# Patient Record
Sex: Female | Born: 1990 | Race: Black or African American | Hispanic: No | Marital: Single | Smoking: Current every day smoker
Health system: Southern US, Community
[De-identification: ages and names within clinical notes are randomized; demographics above are authoritative.]

## PROBLEM LIST (undated history)

## (undated) ENCOUNTER — Inpatient Hospital Stay (HOSPITAL_COMMUNITY): Payer: Self-pay

## (undated) DIAGNOSIS — Z6841 Body Mass Index (BMI) 40.0 and over, adult: Secondary | ICD-10-CM

## (undated) DIAGNOSIS — J45909 Unspecified asthma, uncomplicated: Secondary | ICD-10-CM

## (undated) DIAGNOSIS — I10 Essential (primary) hypertension: Secondary | ICD-10-CM

## (undated) DIAGNOSIS — K8020 Calculus of gallbladder without cholecystitis without obstruction: Secondary | ICD-10-CM

## (undated) DIAGNOSIS — I509 Heart failure, unspecified: Secondary | ICD-10-CM

## (undated) HISTORY — PX: NO PAST SURGERIES: SHX2092

---
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
## 2012-09-06 ENCOUNTER — Encounter (HOSPITAL_COMMUNITY): Payer: Self-pay | Admitting: *Deleted

## 2012-09-06 ENCOUNTER — Inpatient Hospital Stay (HOSPITAL_COMMUNITY): Payer: Medicaid Other

## 2012-09-06 ENCOUNTER — Inpatient Hospital Stay (HOSPITAL_COMMUNITY)
Admission: AD | Admit: 2012-09-06 | Discharge: 2012-09-06 | Disposition: A | Discharge status: Home / Self Care | Payer: Medicaid Other | Source: Ambulatory Visit | Attending: Obstetrics and Gynecology | Admitting: Obstetrics and Gynecology

## 2012-09-06 DIAGNOSIS — R109 Unspecified abdominal pain: Secondary | ICD-10-CM | POA: Insufficient documentation

## 2012-09-06 DIAGNOSIS — O9989 Other specified diseases and conditions complicating pregnancy, childbirth and the puerperium: Secondary | ICD-10-CM

## 2012-09-06 DIAGNOSIS — N949 Unspecified condition associated with female genital organs and menstrual cycle: Secondary | ICD-10-CM | POA: Insufficient documentation

## 2012-09-06 DIAGNOSIS — R102 Pelvic and perineal pain: Secondary | ICD-10-CM

## 2012-09-06 DIAGNOSIS — O99891 Other specified diseases and conditions complicating pregnancy: Secondary | ICD-10-CM | POA: Insufficient documentation

## 2012-09-06 DIAGNOSIS — O10019 Pre-existing essential hypertension complicating pregnancy, unspecified trimester: Secondary | ICD-10-CM | POA: Insufficient documentation

## 2012-09-06 HISTORY — DX: Essential (primary) hypertension: I10

## 2012-09-06 HISTORY — DX: Unspecified asthma, uncomplicated: J45.909

## 2012-09-06 LAB — URINALYSIS, ROUTINE W REFLEX MICROSCOPIC
Bilirubin Urine: NEGATIVE
Ketones, ur: 15 mg/dL — AB
Nitrite: NEGATIVE
Urobilinogen, UA: 4 mg/dL — ABNORMAL HIGH (ref 0.0–1.0)

## 2012-09-06 LAB — URINE MICROSCOPIC-ADD ON

## 2012-09-06 NOTE — MAU Provider Note (Signed)
Attestation of Attending Supervision of Advanced Practitioner: Evaluation and management procedures were performed by the PA/NP/CNM/OB Fellow under my supervision/collaboration. Chart reviewed and agree with management and plan.  Zion Ta V 09/06/2012 6:15 PM

## 2012-09-06 NOTE — MAU Note (Signed)
"  pain in her area", started yesterday, just a sharp pain doesn't really.   Went to mom's dr on Fri, found out she was preg.  Was telling her the pain was under her stomach- not having it now.

## 2012-09-06 NOTE — MAU Note (Signed)
Hx of irregular cycles, last period she thinks was either Dec or Jan.  Hx of high blood pressure, ran out in Jan.  BP was ok at dr's on Fri.

## 2012-09-06 NOTE — MAU Provider Note (Signed)
History     CSN: [id number]  Arrival date and time: 09/06/12 1514   None     Chief Complaint  Patient presents with  . Pelvic Pain   HPI 22 y.o. G1P0 at Unknown EGA with occasional low abd pain, no vaginal bleeding or discharge. Pain has only occurred  + UPT at MD's office on Friday. Has h/o HTN, had run out of medication (lisinopril?), BP normal at PCP's office on Friday, did not restart med.   Past Medical History  Diagnosis Date  . Hypertension   . Asthma     Past Surgical History  Procedure Laterality Date  . No past surgeries      No family history on file.  History  Substance Use Topics  . Smoking status: Never Smoker   . Smokeless tobacco: Not on file  . Alcohol Use: No    Allergies: Allergies not on file   No prescriptions prior to admission    Review of Systems  Constitutional: Negative.   Respiratory: Negative.   Cardiovascular: Negative.   Gastrointestinal: Positive for abdominal pain. Negative for nausea, vomiting, diarrhea and constipation.  Genitourinary: Negative for dysuria, urgency, frequency, hematuria and flank pain.       Negative for vaginal bleeding, vaginal discharge, dyspareunia  Musculoskeletal: Negative.   Neurological: Negative.   Psychiatric/Behavioral: Negative.    Physical Exam   Blood pressure 170/96, pulse 98, temperature 98.4 F (36.9 C), temperature source Oral, resp. rate 20. BP 133/79  Pulse 94  Temp(Src) 98.4 F (36.9 C) (Oral)  Resp 20  Physical Exam  Nursing note and vitals reviewed. Constitutional: She is oriented to person, place, and time. She appears well-developed and well-nourished. No distress.  Morbidly obese   Cardiovascular: Normal rate and regular rhythm.   GI: Soft. She exhibits no mass. There is no tenderness. There is no rebound and no guarding.  Musculoskeletal: Normal range of motion.  Neurological: She is alert and oriented to person, place, and time.  Skin: Skin is warm and dry.   Psychiatric: She has a normal mood and affect.    MAU Course  Procedures  Results for orders placed during the hospital encounter of 09/06/12 (from the past 72 hour(s))  URINALYSIS, ROUTINE W REFLEX MICROSCOPIC     Status: Abnormal   Collection Time    09/06/12  3:45 PM      Result Value Range   Color, Urine YELLOW  YELLOW   APPearance CLEAR  CLEAR   Specific Gravity, Urine 1.025  1.005 - 1.030   pH 6.5  5.0 - 8.0   Glucose, UA NEGATIVE  NEGATIVE mg/dL   Hgb urine dipstick NEGATIVE  NEGATIVE   Bilirubin Urine NEGATIVE  NEGATIVE   Ketones, ur 15 (*) NEGATIVE mg/dL   Protein, ur 30 (*) NEGATIVE mg/dL   Urobilinogen, UA 4.0 (*) 0.0 - 1.0 mg/dL   Nitrite NEGATIVE  NEGATIVE   Leukocytes, UA NEGATIVE  NEGATIVE  URINE MICROSCOPIC-ADD ON     Status: Abnormal   Collection Time    09/06/12  3:45 PM      Result Value Range   Squamous Epithelial / LPF MANY (*) RARE   WBC, UA 3-6  <3 WBC/hpf   Bacteria, UA RARE  RARE   Urine-Other MUCOUS PRESENT     U/S: IUP at 17.6 weeks, + FHR, normal AFI and cervix  Assessment and Plan   1. Pain of round ligament complicating pregnancy, antepartum   Start prenatal care as soon as possible  Precautions rev'd    Medication List    TAKE these medications       albuterol 108 (90 BASE) MCG/ACT inhaler  Commonly known as:  PROVENTIL HFA;VENTOLIN HFA  Inhale 2 puffs into the lungs every 6 (six) hours as needed for shortness of breath.            Follow-up Information   Please follow up. (start prenatal care as soon as possible)         Zaylah Blecha 09/06/2012, 4:23 PM

## 2012-09-11 LAB — OB RESULTS CONSOLE GC/CHLAMYDIA
Chlamydia: NEGATIVE
Gonorrhea: NEGATIVE

## 2012-09-11 LAB — OB RESULTS CONSOLE HIV ANTIBODY (ROUTINE TESTING): HIV: NONREACTIVE

## 2012-09-11 LAB — OB RESULTS CONSOLE ABO/RH

## 2012-09-11 LAB — OB RESULTS CONSOLE RPR: RPR: NONREACTIVE

## 2012-09-11 LAB — OB RESULTS CONSOLE RUBELLA ANTIBODY, IGM: Rubella: IMMUNE

## 2012-10-01 ENCOUNTER — Other Ambulatory Visit (HOSPITAL_COMMUNITY): Payer: Self-pay | Admitting: Obstetrics

## 2012-10-01 DIAGNOSIS — Z3689 Encounter for other specified antenatal screening: Secondary | ICD-10-CM

## 2012-10-06 ENCOUNTER — Encounter (HOSPITAL_COMMUNITY): Payer: Self-pay

## 2012-10-06 ENCOUNTER — Ambulatory Visit (HOSPITAL_COMMUNITY)
Admission: RE | Admit: 2012-10-06 | Discharge: 2012-10-06 | Disposition: A | Discharge status: Home / Self Care | Payer: Medicaid Other | Source: Ambulatory Visit | Attending: Obstetrics | Admitting: Obstetrics

## 2012-10-06 DIAGNOSIS — Z1389 Encounter for screening for other disorder: Secondary | ICD-10-CM | POA: Insufficient documentation

## 2012-10-06 DIAGNOSIS — O358XX0 Maternal care for other (suspected) fetal abnormality and damage, not applicable or unspecified: Secondary | ICD-10-CM | POA: Insufficient documentation

## 2012-10-06 DIAGNOSIS — Z3689 Encounter for other specified antenatal screening: Secondary | ICD-10-CM

## 2012-10-06 DIAGNOSIS — E669 Obesity, unspecified: Secondary | ICD-10-CM | POA: Insufficient documentation

## 2012-10-06 DIAGNOSIS — Z363 Encounter for antenatal screening for malformations: Secondary | ICD-10-CM | POA: Insufficient documentation

## 2012-10-06 DIAGNOSIS — O10019 Pre-existing essential hypertension complicating pregnancy, unspecified trimester: Secondary | ICD-10-CM | POA: Insufficient documentation

## 2012-11-02 ENCOUNTER — Other Ambulatory Visit (HOSPITAL_COMMUNITY): Payer: Self-pay | Admitting: Obstetrics

## 2012-11-04 ENCOUNTER — Ambulatory Visit (HOSPITAL_COMMUNITY): Payer: Medicaid Other

## 2012-11-11 ENCOUNTER — Other Ambulatory Visit (HOSPITAL_COMMUNITY): Payer: Self-pay | Admitting: Obstetrics

## 2012-11-11 ENCOUNTER — Ambulatory Visit (HOSPITAL_COMMUNITY)
Admission: RE | Admit: 2012-11-11 | Discharge: 2012-11-11 | Disposition: A | Discharge status: Home / Self Care | Payer: Medicaid Other | Source: Ambulatory Visit | Attending: Obstetrics | Admitting: Obstetrics

## 2012-11-11 VITALS — BP 134/96 | HR 110 | Wt 368.2 lb

## 2012-11-11 DIAGNOSIS — O99210 Obesity complicating pregnancy, unspecified trimester: Secondary | ICD-10-CM | POA: Insufficient documentation

## 2012-11-11 DIAGNOSIS — E669 Obesity, unspecified: Secondary | ICD-10-CM | POA: Insufficient documentation

## 2012-11-11 DIAGNOSIS — O10019 Pre-existing essential hypertension complicating pregnancy, unspecified trimester: Secondary | ICD-10-CM | POA: Insufficient documentation

## 2012-11-11 NOTE — Progress Notes (Signed)
Marcelline S Crady  was seen today for an ultrasound appointment.  See full report in [facility]/GYN.  Impression: Single IUP at 27 2/7 weeks Follow up due to Chronic hypertension, obesity Normal interval anatomy - somewhat limited views of the heart were obtained (arches) Normal interval growth (25th %tile) Normal amnitic fluid volume TVUS - cervical length 4.2 cm without funneling or dynamic changes  Recommendations: Recommend follow-up ultrasound examination in 4 weeks for interval growth.  Alpha Gula, MD

## 2012-12-09 ENCOUNTER — Other Ambulatory Visit (HOSPITAL_COMMUNITY): Payer: Self-pay | Admitting: Maternal and Fetal Medicine

## 2012-12-09 ENCOUNTER — Ambulatory Visit (HOSPITAL_COMMUNITY)
Admission: RE | Admit: 2012-12-09 | Discharge: 2012-12-09 | Disposition: A | Discharge status: Home / Self Care | Payer: Medicaid Other | Source: Ambulatory Visit | Attending: Obstetrics | Admitting: Obstetrics

## 2012-12-09 VITALS — BP 134/84 | HR 115 | Wt 377.0 lb

## 2012-12-09 DIAGNOSIS — O10019 Pre-existing essential hypertension complicating pregnancy, unspecified trimester: Secondary | ICD-10-CM | POA: Insufficient documentation

## 2012-12-09 DIAGNOSIS — E669 Obesity, unspecified: Secondary | ICD-10-CM | POA: Insufficient documentation

## 2012-12-09 DIAGNOSIS — O368190 Decreased fetal movements, unspecified trimester, not applicable or unspecified: Secondary | ICD-10-CM | POA: Insufficient documentation

## 2012-12-09 NOTE — Progress Notes (Signed)
Carmen Palmer  was seen today for an ultrasound appointment.  See full report in [facility]OB/GYN.  Impression: Single IUP at 30 5/7 weeks Follow up due to Chronic hypertension, obesity Limited views of the fetal anatomy obtained due to maternal body habitus Normal interval growth (40th %tile); the femur length measures < 3rd %tile, but appears morphlogically normal- no findings suspicious for skeletal dysplasia Normal amnitic fluid volume  Recommendations: Recommend follow-up ultrasound examination in 4 weeks for interval growth  Alpha Gula, MD

## 2012-12-09 NOTE — ED Notes (Signed)
Patient states no movement since yesterday. [place] [employer] made aware.

## 2013-01-04 LAB — OB RESULTS CONSOLE GC/CHLAMYDIA
Chlamydia: NEGATIVE
Gonorrhea: NEGATIVE

## 2013-01-06 ENCOUNTER — Encounter (HOSPITAL_COMMUNITY): Payer: Self-pay | Admitting: *Deleted

## 2013-01-06 ENCOUNTER — Ambulatory Visit (HOSPITAL_COMMUNITY)
Admission: RE | Admit: 2013-01-06 | Discharge: 2013-01-06 | Disposition: A | Discharge status: Home / Self Care | Payer: Medicaid Other | Source: Ambulatory Visit | Attending: Obstetrics | Admitting: Obstetrics

## 2013-01-06 ENCOUNTER — Inpatient Hospital Stay (HOSPITAL_COMMUNITY)
Admission: AD | Admit: 2013-01-06 | Discharge: 2013-01-06 | Disposition: A | Discharge status: Home / Self Care | Payer: Medicaid Other | Source: Ambulatory Visit | Attending: Obstetrics | Admitting: Obstetrics

## 2013-01-06 ENCOUNTER — Inpatient Hospital Stay (EMERGENCY_DEPARTMENT_HOSPITAL)
Admission: AD | Admit: 2013-01-06 | Discharge: 2013-01-07 | Disposition: A | Discharge status: Home / Self Care | Payer: Medicaid Other | Source: Ambulatory Visit | Attending: Obstetrics | Admitting: Obstetrics

## 2013-01-06 ENCOUNTER — Encounter (HOSPITAL_COMMUNITY): Payer: Self-pay

## 2013-01-06 ENCOUNTER — Inpatient Hospital Stay (HOSPITAL_COMMUNITY): Payer: Medicaid Other

## 2013-01-06 VITALS — BP 128/76 | HR 118 | Wt 377.2 lb

## 2013-01-06 DIAGNOSIS — O368190 Decreased fetal movements, unspecified trimester, not applicable or unspecified: Secondary | ICD-10-CM | POA: Insufficient documentation

## 2013-01-06 DIAGNOSIS — K8020 Calculus of gallbladder without cholecystitis without obstruction: Secondary | ICD-10-CM

## 2013-01-06 DIAGNOSIS — O10019 Pre-existing essential hypertension complicating pregnancy, unspecified trimester: Secondary | ICD-10-CM

## 2013-01-06 DIAGNOSIS — O368131 Decreased fetal movements, third trimester, fetus 1: Secondary | ICD-10-CM

## 2013-01-06 DIAGNOSIS — O10913 Unspecified pre-existing hypertension complicating pregnancy, third trimester: Secondary | ICD-10-CM

## 2013-01-06 LAB — COMPREHENSIVE METABOLIC PANEL
ALT: 23 U/L (ref 0–35)
CO2: 24 mEq/L (ref 19–32)
Calcium: 9.6 mg/dL (ref 8.4–10.5)
GFR calc Af Amer: >90 mL/min (ref >90)
GFR calc non Af Amer: >90 mL/min (ref >90)
Glucose, Bld: 93 mg/dL (ref 70–99)
Sodium: 134 mEq/L — ABNORMAL LOW (ref 135–145)
Total Bilirubin: 0.3 mg/dL (ref 0.3–1.2)

## 2013-01-06 LAB — URINALYSIS, ROUTINE W REFLEX MICROSCOPIC
Bilirubin Urine: NEGATIVE
Glucose, UA: NEGATIVE mg/dL
Hgb urine dipstick: NEGATIVE
Ketones, ur: NEGATIVE mg/dL
Leukocytes, UA: NEGATIVE
Protein, ur: NEGATIVE mg/dL

## 2013-01-06 LAB — CBC
Hemoglobin: 12.1 g/dL (ref 12.0–15.0)
MCH: 28.6 pg (ref 26.0–34.0)
MCV: 83 fL (ref 78.0–100.0)
RBC: 4.23 MIL/uL (ref 3.87–5.11)

## 2013-01-06 MED ORDER — METHYLDOPA 500 MG PO TABS: 500.0000 mg | ORAL_TABLET | Freq: Three times a day (TID) | ORAL | Status: DC

## 2013-01-06 MED ORDER — GI COCKTAIL ~~LOC~~
30.0000 mL | Freq: Once | ORAL | Status: AC
  Administered 2013-01-06: 30 mL via ORAL
  Filled 2013-01-06: qty 30

## 2013-01-06 NOTE — MAU Note (Signed)
Pt feels upper abdominal pain. She says pain started 2 hours ago.

## 2013-01-06 NOTE — MAU Note (Signed)
Pt sent over from MFM for elevated B/P.

## 2013-01-06 NOTE — Progress Notes (Signed)
Carmen Palmer  was seen today for an ultrasound appointment.  See full report in [facility]OB/GY[facility].  Impression: Single IUP at 34 5/7 weeks Follow up due to Chronic hypertension, obesity Limited views of the fetal anatomy obtained due to maternal body habitus Normal interval growth (37th %tile) Normal amnitic fluid volume  BPs: 158/106; repeat 128/76  Recommendations: Discussed with Dr.Marshall. Patient was sent to MAU for preeclampsia labs  Recommend 2x weekly NSTs with weekly AFIs.  Alpha Gula, MD

## 2013-01-06 NOTE — MAU Note (Signed)
Patient is sent from MFM due to elevated BP. Patient states that takes her hypertensive medications in the morning. She denies any headache, visual disturbance or epigastric pain. She denies any vaginal bleeding or lof. She reports decreased fetal movement.

## 2013-01-06 NOTE — MAU Provider Note (Signed)
Chief Complaint:  Hypertension   First Provider Initiated Contact with Patient 01/06/13 1512      HPI: Carmen Palmer is a 22 y.o. G1P0000 at [redacted]w[redacted]d who presents to maternity admissions sent from MFM for elevated BP. She also reports decreased fetal movement.  She denies h/a, epigastric pain, or visual disturbances.  She also denies LOF, vaginal bleeding, vaginal itching/burning, urinary symptoms, dizziness, n/v, or fever/chills.    Past Medical History: Past Medical History  Diagnosis Date  . Hypertension   . Asthma     Past obstetric history: OB History  Gravida Para Term Preterm AB SAB TAB Ectopic Multiple Living  1 0 0 0 0 0 0 0 0 0     # Outcome Date GA Lbr Len/2nd Weight Sex Delivery Anes PTL Lv  1 CUR               Past Surgical History: Past Surgical History  Procedure Laterality Date  . No past surgeries      Family History: History reviewed. No pertinent family history.  Social History: History  Substance Use Topics  . Smoking status: Never Smoker   . Smokeless tobacco: Not on file  . Alcohol Use: No    Allergies: No Known Allergies  Meds:  Prescriptions prior to admission  Medication Sig Dispense Refill  . albuterol (PROVENTIL HFA;VENTOLIN HFA) 108 (90 BASE) MCG/ACT inhaler Inhale 2 puffs into the lungs every 6 (six) hours as needed for shortness of breath.      . methyldopa (ALDOMET) 500 MG tablet Take 500 mg by mouth 2 (two) times daily.      . Prenatal Vit-Fe Fumarate-FA (PRENATAL MULTIVITAMIN) TABS tablet Take 1 tablet by mouth daily at 12 noon.        ROS: Pertinent findings in history of present illness.  Physical Exam  Blood pressure 141/85, pulse 109, temperature 98.3 F (36.8 C), temperature source Oral, resp. rate 18, height 5\' 1"  (1.549 m), weight 171.006 kg (377 lb). GENERAL: Well-developed, well-nourished female in no acute distress.  HEENT: normocephalic HEART: normal rate RESP: normal effort ABDOMEN: Soft, non-tender, gravid  appropriate for gestational age EXTREMITIES: Nontender, no edema NEURO: alert and oriented     FHT:  EFM applied, indeterminate baseline with intermittent tracing r/t body habitus    Labs: Results for orders placed during the hospital encounter of 01/06/13 (from the past 24 hour(s))  URINALYSIS, ROUTINE W REFLEX MICROSCOPIC     Status: None   Collection Time    01/06/13  2:50 PM      Result Value Range   Color, Urine YELLOW  YELLOW   APPearance CLEAR  CLEAR   Specific Gravity, Urine 1.020  1.005 - 1.030   pH 7.0  5.0 - 8.0   Glucose, UA NEGATIVE  NEGATIVE mg/dL   Hgb urine dipstick NEGATIVE  NEGATIVE   Bilirubin Urine NEGATIVE  NEGATIVE   Ketones, ur NEGATIVE  NEGATIVE mg/dL   Protein, ur NEGATIVE  NEGATIVE mg/dL   Urobilinogen, UA 0.2  0.0 - 1.0 mg/dL   Nitrite NEGATIVE  NEGATIVE   Leukocytes, UA NEGATIVE  NEGATIVE  CBC     Status: Abnormal   Collection Time    01/06/13  3:18 PM      Result Value Range   WBC 7.5  4.0 - 10.5 K/uL   RBC 4.23  3.87 - 5.11 MIL/uL   Hemoglobin 12.1  12.0 - 15.0 g/dL   HCT 40.9 (*) 81.1 - 91.4 %   MCV  83.0  78.0 - 100.0 fL   MCH 28.6  26.0 - 34.0 pg   MCHC 34.5  30.0 - 36.0 g/dL   RDW 40.9  81.1 - 91.4 %   Platelets 318  150 - 400 K/uL  COMPREHENSIVE METABOLIC PANEL     Status: Abnormal   Collection Time    01/06/13  3:18 PM      Result Value Range   Sodium 134 (*) 135 - 145 mEq/L   Potassium 4.0  3.5 - 5.1 mEq/L   Chloride 101  96 - 112 mEq/L   CO2 24  19 - 32 mEq/L   Glucose, Bld 93  70 - 99 mg/dL   BUN 9  6 - 23 mg/dL   Creatinine, Ser 7.82  0.50 - 1.10 mg/dL   Calcium 9.6  8.4 - 95.6 mg/dL   Total Protein 7.3  6.0 - 8.3 g/dL   Albumin 2.9 (*) 3.5 - 5.2 g/dL   AST 16  0 - 37 U/L   ALT 23  0 - 35 U/L   Alkaline Phosphatase 104  39 - 117 U/L   Total Bilirubin 0.3  0.3 - 1.2 mg/dL   GFR calc non Af Amer >90  >90 mL/min   GFR calc Af Amer >90  >90 mL/min  URIC ACID     Status: None   Collection Time    01/06/13  3:18 PM       Result Value Range   Uric Acid, Serum 5.6  2.4 - 7.0 mg/dL  LACTATE DEHYDROGENASE     Status: None   Collection Time    01/06/13  3:18 PM      Result Value Range   LDH 144  94 - 250 U/L    Imaging:     Assessment: 1. Chronic hypertension in pregnancy, third trimester   2. Decreased fetal movement in pregnancy, third trimester, fetus 1   3.  BPP 8/8  Plan: Consult Dr Gaynell Face Discharge home Labor precautions and fetal kick counts F/U in office as scheduled Return to MAU as needed    Medication List    ASK your doctor about these medications       albuterol 108 (90 BASE) MCG/ACT inhaler  Commonly known as:  PROVENTIL HFA;VENTOLIN HFA  Inhale 2 puffs into the lungs every 6 (six) hours as needed for shortness of breath.     methyldopa 500 MG tablet  Commonly known as:  ALDOMET  Take 500 mg by mouth 2 (two) times daily.     prenatal multivitamin Tabs tablet  Take 1 tablet by mouth daily at 12 noon.        Sharen Counter Certified Nurse-Midwife 01/06/2013 4:22 PM

## 2013-01-07 ENCOUNTER — Inpatient Hospital Stay (HOSPITAL_COMMUNITY): Payer: Medicaid Other

## 2013-01-07 DIAGNOSIS — K8020 Calculus of gallbladder without cholecystitis without obstruction: Secondary | ICD-10-CM | POA: Diagnosis present

## 2013-01-07 DIAGNOSIS — O26619 Liver and biliary tract disorders in pregnancy, unspecified trimester: Secondary | ICD-10-CM

## 2013-01-07 MED ORDER — ACETAMINOPHEN-CODEINE #3 300-30 MG PO TABS: 1.0000 | ORAL_TABLET | ORAL | Status: DC | PRN

## 2013-01-07 MED ORDER — OXYCODONE-ACETAMINOPHEN 5-325 MG PO TABS
2.0000 | ORAL_TABLET | Freq: Once | ORAL | Status: AC
  Administered 2013-01-07: 2 via ORAL
  Filled 2013-01-07: qty 2

## 2013-01-07 MED ORDER — ONDANSETRON HCL 8 MG PO TABS: 4.0000 mg | ORAL_TABLET | Freq: Two times a day (BID) | ORAL | Status: DC | PRN

## 2013-01-07 MED ORDER — PANTOPRAZOLE SODIUM 40 MG PO TBEC
40.0000 mg | DELAYED_RELEASE_TABLET | Freq: Once | ORAL | Status: AC
  Administered 2013-01-07: 40 mg via ORAL
  Filled 2013-01-07: qty 1

## 2013-01-07 MED ORDER — FAMOTIDINE 20 MG PO TABS
20.0000 mg | ORAL_TABLET | Freq: Once | ORAL | Status: AC
  Administered 2013-01-07: 20 mg via ORAL
  Filled 2013-01-07: qty 1

## 2013-01-07 NOTE — MAU Provider Note (Signed)
Chief Complaint:  Abdominal Pain   First Provider Initiated Contact with Patient 01/06/13 2359      HPI: Carmen Palmer is a 22 y.o. G1P0000 at [redacted]w[redacted]d who presents to maternity admissions reporting severe mid upper abd pain x 2 hours. Was sent to MAU this afternoon from MFM for Pre-E work-up. Normal labs. BP labile, but improved during MAU visit. Had not eaten all day due to being at MFM and MAU. Ate hamburger and fries at [employer]. Pain started ~ 2 hours later. Severe, constant, sharp. 9/10 on pain scale.   Denies Hx of similar pain. Denies fever, chills, nausea, vomiting, diarrhea, constipation, leakage of fluid, vaginal bleeding, low abd pain, HA or vision changes. Good fetal movement.   Pregnancy Course: Complicated by [facility].   Past Medical History: Past Medical History  Diagnosis Date  . Hypertension   . Asthma     Past obstetric history: OB History  Gravida Para Term Preterm AB SAB TAB Ectopic Multiple Living  1 0 0 0 0 0 0 0 0 0     # Outcome Date GA Lbr Len/2nd Weight Sex Delivery Anes PTL Lv  1 CUR               Past Surgical History: Past Surgical History  Procedure Laterality Date  . No past surgeries       Family History: Family History  Problem Relation Age of Onset  . Hypertension Mother   . Heart disease Father   . Hypertension Father     Social History: History  Substance Use Topics  . Smoking status: Never Smoker   . Smokeless tobacco: Not on file  . Alcohol Use: No    Allergies: No Known Allergies  Meds:  No prescriptions prior to admission    ROS: Pertinent findings in history of present illness.  Physical Exam  Blood pressure 117/53, pulse 115, temperature 97.9 F (36.6 C), temperature source Oral, resp. rate 18. Patient Vitals for the past 24 hrs:  BP Temp Temp src Pulse Resp  01/07/13 0240 117/53 mmHg - - 115 18  01/06/13 2346 122/76 mmHg - - 99 -  01/06/13 2340 117/66 mmHg - - 109 -  01/06/13 2320 111/58 mmHg 97.9 F (36.6 C)  Oral 105 20  01/06/13 2311 111/58 mmHg - - 105 -    GENERAL: Well-developed, well-nourished, morbidly obese female in moderate distress. Curled up on side, making rhythmic mvmts.  HEENT: normocephalic HEART: normal rate RESP: normal effort ABDOMEN: Soft, mild epigastric tenderness, gravid appropriate for gestational age. Negative CVA tenderness. Positive bowel sounds. EXTREMITIES: Nontender, no edema NEURO: alert and oriented SPECULUM EXAM: Deferred    FHT:  Baseline 130 , moderate variability, accelerations present, no decelerations Contractions: none   Labs: Results for orders placed during the hospital encounter of 01/06/13 (from the past 24 hour(s))  URINALYSIS, ROUTINE W REFLEX MICROSCOPIC     Status: None   Collection Time    01/06/13  2:50 PM      Result Value Range   Color, Urine YELLOW  YELLOW   APPearance CLEAR  CLEAR   Specific Gravity, Urine 1.020  1.005 - 1.030   pH 7.0  5.0 - 8.0   Glucose, UA NEGATIVE  NEGATIVE mg/dL   Hgb urine dipstick NEGATIVE  NEGATIVE   Bilirubin Urine NEGATIVE  NEGATIVE   Ketones, ur NEGATIVE  NEGATIVE mg/dL   Protein, ur NEGATIVE  NEGATIVE mg/dL   Urobilinogen, UA 0.2  0.0 - 1.0 mg/dL  Nitrite NEGATIVE  NEGATIVE   Leukocytes, UA NEGATIVE  NEGATIVE  CBC     Status: Abnormal   Collection Time    01/06/13  3:18 PM      Result Value Range   WBC 7.5  4.0 - 10.5 K/uL   RBC 4.23  3.87 - 5.11 MIL/uL   Hemoglobin 12.1  12.0 - 15.0 g/dL   HCT 16.1 (*) 09.6 - 04.5 %   MCV 83.0  78.0 - 100.0 fL   MCH 28.6  26.0 - 34.0 pg   MCHC 34.5  30.0 - 36.0 g/dL   RDW 40.9  81.1 - 91.4 %   Platelets 318  150 - 400 K/uL  COMPREHENSIVE METABOLIC PANEL     Status: Abnormal   Collection Time    01/06/13  3:18 PM      Result Value Range   Sodium 134 (*) 135 - 145 mEq/L   Potassium 4.0  3.5 - 5.1 mEq/L   Chloride 101  96 - 112 mEq/L   CO2 24  19 - 32 mEq/L   Glucose, Bld 93  70 - 99 mg/dL   BUN 9  6 - 23 mg/dL   Creatinine, Ser 7.82  0.50 - 1.10  mg/dL   Calcium 9.6  8.4 - 95.6 mg/dL   Total Protein 7.3  6.0 - 8.3 g/dL   Albumin 2.9 (*) 3.5 - 5.2 g/dL   AST 16  0 - 37 U/L   ALT 23  0 - 35 U/L   Alkaline Phosphatase 104  39 - 117 U/L   Total Bilirubin 0.3  0.3 - 1.2 mg/dL   GFR calc non Af Amer >90  >90 mL/min   GFR calc Af Amer >90  >90 mL/min  URIC ACID     Status: None   Collection Time    01/06/13  3:18 PM      Result Value Range   Uric Acid, Serum 5.6  2.4 - 7.0 mg/dL  LACTATE DEHYDROGENASE     Status: None   Collection Time    01/06/13  3:18 PM      Result Value Range   LDH 144  94 - 250 U/L    Imaging:  01/06/2013 BPP 8/8.  US Abdomen Limited Ruq  01/07/2013   CLINICAL DATA:  Epigastric pain.  EXAM: US ABDOMEN LIMITED - RIGHT UPPER QUADRANT  COMPARISON:  None.  FINDINGS: Gallbladder  Layering sludge. Tiny echogenic foci within the gallbladder, likely small gravel/stones. No wall thickening. Negative sonographic Murphy's.  Common bile duct  Diameter: Normal caliber, 4 mm.  Liver:  No focal lesion identified. Within normal limits in parenchymal echogenicity.  IMPRESSION: Suspect small stones/ gravel and sludge within the gallbladder. No sonographic evidence of acute cholecystitis.   Electronically Signed   By: Charlett Nose M.D.   On: 01/07/2013 02:08    MAU Course: GI cocktail given. No relief. Pt had BM. No relief. Percocet ordered.  Per Dr. Gaynell Face order RUQ [place], Protonix, Zantac (Pepcid substituted).   Pain resolved.  Assessment: 1. Cholelithiasis complicating pregnancy in third trimester, antepartum    Plan: Discharge home in stable condition. Discussed low-fat diet to avoid gallbladder attacks. Handout given. May need general surgery consult. Preterm pabor precautions and fetal kick counts. Stool softener as needed while taking Tylenol No. 3 and Zofran.     Follow-up Information   Follow up with MARSHALL,BERNARD A, MD. (As scheduled or as needed if symptoms worsen)    Specialty:  Obstetrics  and  Gynecology   Contact information:   [address] [place] [address] [phone]       Follow [facility] with [facility]. (As needed for fever greater than 100.4, pain that does not improve with pain medication or nausea and vomiting not controlled by medication.)    Contact information:   [address] [id number] [place] [address] [phone]       Medication List         acetaminophen-codeine 300-30 MG per tablet  Commonly known as:  TYLENOL #3  Take 1-2 tablets by mouth every 4 (four) hours as needed for pain.     albuterol 108 (90 BASE) MCG/ACT inhaler  Commonly known as:  PROVENTIL HFA;VENTOLIN HFA  Inhale 2 puffs into the lungs every 6 (six) hours as needed for shortness of breath.     methyldopa 500 MG tablet  Commonly known as:  ALDOMET  Take 1 tablet (500 mg total) by mouth 3 (three) times daily.     ondansetron 8 MG tablet  Commonly known as:  ZOFRAN  Take 0.5 tablets (4 mg total) by mouth every 12 (twelve) hours as needed for nausea.     prenatal multivitamin Tabs tablet  Take 1 tablet by mouth daily at 12 noon.       [place], CNM 01/07/2013 3:59 AM

## 2013-01-07 NOTE — Progress Notes (Signed)
2 percocet given

## 2013-01-08 ENCOUNTER — Ambulatory Visit (HOSPITAL_COMMUNITY): Payer: Medicaid Other | Attending: Obstetrics

## 2013-01-12 ENCOUNTER — Ambulatory Visit (HOSPITAL_COMMUNITY): Payer: Medicaid Other

## 2013-01-15 ENCOUNTER — Ambulatory Visit (HOSPITAL_COMMUNITY)
Admit: 2013-01-15 | Discharge: 2013-01-15 | Disposition: A | Discharge status: Home / Self Care | Payer: Medicaid Other | Attending: Obstetrics | Admitting: Obstetrics

## 2013-01-15 DIAGNOSIS — O10019 Pre-existing essential hypertension complicating pregnancy, unspecified trimester: Secondary | ICD-10-CM | POA: Insufficient documentation

## 2013-01-15 DIAGNOSIS — E669 Obesity, unspecified: Secondary | ICD-10-CM | POA: Insufficient documentation

## 2013-01-15 DIAGNOSIS — O10913 Unspecified pre-existing hypertension complicating pregnancy, third trimester: Secondary | ICD-10-CM

## 2013-01-15 NOTE — Progress Notes (Signed)
Carmen Palmer  was seen today for an ultrasound appointment.  See full report in AS-OB/GYN.  Impression: Single IUP at 36 0/7 weeks Follow up due to [facility], obesity Normal amniotic fluid volume (AFI 12.5 cm) Reactive NST - normal modified BPP  BPs: 153/104; repeat 137/93- had normal preelcampsia labs last week  Recommendations: Continue 2x weekly NSTs with weekly AFIs If testing otherwise remains reassuring, recommend induction of labor no earlier than [redacted] weeks gestation.  Alpha Gula, MD

## 2013-01-19 ENCOUNTER — Ambulatory Visit (HOSPITAL_COMMUNITY)
Admission: RE | Admit: 2013-01-19 | Discharge: 2013-01-19 | Disposition: A | Discharge status: Home / Self Care | Payer: Medicaid Other | Source: Ambulatory Visit | Attending: Obstetrics | Admitting: Obstetrics

## 2013-01-19 ENCOUNTER — Encounter (HOSPITAL_COMMUNITY): Payer: Self-pay

## 2013-01-19 DIAGNOSIS — O10019 Pre-existing essential hypertension complicating pregnancy, unspecified trimester: Secondary | ICD-10-CM | POA: Insufficient documentation

## 2013-01-19 DIAGNOSIS — E669 Obesity, unspecified: Secondary | ICD-10-CM | POA: Insufficient documentation

## 2013-01-22 ENCOUNTER — Encounter (HOSPITAL_COMMUNITY): Payer: Self-pay

## 2013-01-22 ENCOUNTER — Ambulatory Visit (HOSPITAL_COMMUNITY)
Admission: RE | Admit: 2013-01-22 | Discharge: 2013-01-22 | Disposition: A | Discharge status: Home / Self Care | Payer: Medicaid Other | Source: Ambulatory Visit | Attending: Obstetrics | Admitting: Obstetrics

## 2013-01-22 ENCOUNTER — Other Ambulatory Visit (HOSPITAL_COMMUNITY): Payer: Self-pay | Admitting: Maternal and Fetal Medicine

## 2013-01-22 DIAGNOSIS — O10913 Unspecified pre-existing hypertension complicating pregnancy, third trimester: Secondary | ICD-10-CM

## 2013-01-22 DIAGNOSIS — O10019 Pre-existing essential hypertension complicating pregnancy, unspecified trimester: Secondary | ICD-10-CM | POA: Insufficient documentation

## 2013-01-22 DIAGNOSIS — E669 Obesity, unspecified: Secondary | ICD-10-CM | POA: Insufficient documentation

## 2013-01-26 ENCOUNTER — Encounter (HOSPITAL_COMMUNITY): Payer: Self-pay | Admitting: *Deleted

## 2013-01-26 ENCOUNTER — Encounter (HOSPITAL_COMMUNITY): Payer: Self-pay

## 2013-01-26 ENCOUNTER — Other Ambulatory Visit (HOSPITAL_COMMUNITY): Payer: Self-pay | Admitting: Obstetrics

## 2013-01-26 ENCOUNTER — Telehealth (HOSPITAL_COMMUNITY): Payer: Self-pay | Admitting: *Deleted

## 2013-01-26 ENCOUNTER — Ambulatory Visit (HOSPITAL_COMMUNITY)
Admission: RE | Admit: 2013-01-26 | Discharge: 2013-01-26 | Disposition: A | Discharge status: Home / Self Care | Payer: Medicaid Other | Source: Ambulatory Visit | Attending: Obstetrics | Admitting: Obstetrics

## 2013-01-26 DIAGNOSIS — E669 Obesity, unspecified: Secondary | ICD-10-CM

## 2013-01-26 DIAGNOSIS — O9989 Other specified diseases and conditions complicating pregnancy, childbirth and the puerperium: Secondary | ICD-10-CM

## 2013-01-26 DIAGNOSIS — O10013 Pre-existing essential hypertension complicating pregnancy, third trimester: Secondary | ICD-10-CM

## 2013-01-26 DIAGNOSIS — O10019 Pre-existing essential hypertension complicating pregnancy, unspecified trimester: Secondary | ICD-10-CM | POA: Insufficient documentation

## 2013-01-26 NOTE — Telephone Encounter (Signed)
Preadmission screen  

## 2013-01-29 ENCOUNTER — Encounter (HOSPITAL_COMMUNITY): Payer: Self-pay

## 2013-01-29 ENCOUNTER — Ambulatory Visit (HOSPITAL_COMMUNITY)
Admission: RE | Admit: 2013-01-29 | Discharge: 2013-01-29 | Disposition: A | Discharge status: Home / Self Care | Payer: Medicaid Other | Source: Ambulatory Visit | Attending: Obstetrics | Admitting: Obstetrics

## 2013-01-29 ENCOUNTER — Encounter (HOSPITAL_COMMUNITY): Payer: Self-pay | Admitting: *Deleted

## 2013-01-29 ENCOUNTER — Other Ambulatory Visit (HOSPITAL_COMMUNITY): Payer: Self-pay | Admitting: Obstetrics

## 2013-01-29 ENCOUNTER — Inpatient Hospital Stay (HOSPITAL_COMMUNITY)
Admission: AD | Admit: 2013-01-29 | Discharge: 2013-01-29 | Disposition: A | Discharge status: Home / Self Care | Payer: Medicaid Other | Source: Ambulatory Visit | Attending: Obstetrics | Admitting: Obstetrics

## 2013-01-29 DIAGNOSIS — O9989 Other specified diseases and conditions complicating pregnancy, childbirth and the puerperium: Secondary | ICD-10-CM

## 2013-01-29 DIAGNOSIS — E669 Obesity, unspecified: Secondary | ICD-10-CM | POA: Insufficient documentation

## 2013-01-29 DIAGNOSIS — O10019 Pre-existing essential hypertension complicating pregnancy, unspecified trimester: Secondary | ICD-10-CM | POA: Insufficient documentation

## 2013-01-29 DIAGNOSIS — O10013 Pre-existing essential hypertension complicating pregnancy, third trimester: Secondary | ICD-10-CM

## 2013-01-29 DIAGNOSIS — Z3689 Encounter for other specified antenatal screening: Secondary | ICD-10-CM

## 2013-01-29 DIAGNOSIS — Z6841 Body Mass Index (BMI) 40.0 and over, adult: Secondary | ICD-10-CM | POA: Insufficient documentation

## 2013-01-29 HISTORY — DX: Body Mass Index (BMI) 40.0 and over, adult: Z684

## 2013-01-29 HISTORY — DX: Morbid (severe) obesity due to excess calories: E66.01

## 2013-01-29 NOTE — MAU Note (Addendum)
Sent from office (or MFM) for NST and BP check.

## 2013-01-29 NOTE — MAU Provider Note (Signed)
Chief Complaint:  Hypertension   First Provider Initiated Contact with Patient 01/29/13 1743      HPI: Carmen Palmer is a 22 y.o. G1P0000 at 45w0dwho presents to maternity admissions sent from office for NST. Pt reports she had 6/8 on U/S test and was sent for 2 more points for the baby.  She is in routine testing for [facility] in pregnancy. She denies h/a, visual disturbances, or epigastric pain.  She reports good fetal movement, denies regular contractions, LOF, vaginal bleeding, vaginal itching/burning, urinary symptoms, dizziness, n/v, or fever/chills.      Past Medical History: Past Medical History  Diagnosis Date  . Hypertension   . Asthma   . Morbid obesity with BMI of 70 and over, adult     Past obstetric history: OB History  Gravida Para Term Preterm AB SAB TAB Ectopic Multiple Living  1 0 0 0 0 0 0 0 0 0     # Outcome Date GA Lbr Len/2nd Weight Sex Delivery Anes PTL Lv  1 CUR               Past Surgical History: Past Surgical History  Procedure Laterality Date  . No past surgeries      Family History: Family History  Problem Relation Age of Onset  . Hypertension Mother   . Heart disease Father   . Hypertension Father     Social History: History  Substance Use Topics  . Smoking status: Never Smoker   . Smokeless tobacco: Not on file  . Alcohol Use: No    Allergies: No Known Allergies  Meds:  Prescriptions prior to admission  Medication Sig Dispense Refill  . albuterol (PROVENTIL HFA;VENTOLIN HFA) 108 (90 BASE) MCG/ACT inhaler Inhale 2 puffs into the lungs every 6 (six) hours as needed for shortness of breath.      . methyldopa (ALDOMET) 500 MG tablet Take 1 tablet (500 mg total) by mouth 3 (three) times daily.  90 tablet  1  . Prenatal Vit-Fe Fumarate-FA (PRENATAL MULTIVITAMIN) TABS tablet Take 1 tablet by mouth daily at 12 noon.        ROS: Pertinent findings in history of present illness.  Physical Exam  Blood pressure 131/67, pulse 103,  temperature 98.6 F (37 C), temperature source Oral, resp. rate 18, height 5\' 2"  (1.575 m), weight 175.088 kg (386 lb). GENERAL: Well-developed, well-nourished female in no acute distress.  HEENT: normocephalic HEART: normal rate RESP: normal effort ABDOMEN: Soft, non-tender, gravid appropriate for gestational age EXTREMITIES: Nontender, no edema NEURO: alert and oriented SPECULUM EXAM: NEFG, physiologic discharge, no blood, cervix clean    FHT:  Baseline 135 , moderate variability, accelerations present (15x15), no decelerations Contractions: irregular, 2-10 min mild   Assessment: 1. NST (non-stress test) reactive     Plan: Consult Dr Gaynell Face to confirm NST as plan  Discharge home Labor precautions, fetal kick counts, preeclampsia precautions F/U in office as scheduled Return to MAU as needed  Sharen Counter Certified Nurse-Midwife 01/29/2013 6:00 PM

## 2013-01-30 ENCOUNTER — Inpatient Hospital Stay (HOSPITAL_COMMUNITY)
Admission: AD | Admit: 2013-01-30 | Discharge: 2013-01-30 | Disposition: A | Discharge status: Home / Self Care | Payer: Medicaid Other | Source: Ambulatory Visit | Attending: Obstetrics | Admitting: Obstetrics

## 2013-01-30 ENCOUNTER — Encounter (HOSPITAL_COMMUNITY): Payer: Self-pay | Admitting: *Deleted

## 2013-01-30 DIAGNOSIS — O99891 Other specified diseases and conditions complicating pregnancy: Secondary | ICD-10-CM | POA: Insufficient documentation

## 2013-01-30 DIAGNOSIS — N949 Unspecified condition associated with female genital organs and menstrual cycle: Secondary | ICD-10-CM | POA: Insufficient documentation

## 2013-01-30 DIAGNOSIS — O479 False labor, unspecified: Secondary | ICD-10-CM

## 2013-01-30 NOTE — MAU Note (Signed)
Pt presents with complaints of leakage of fluid this morning, clear fluid. States some contractions on and off today. Denies any bleeding

## 2013-01-30 NOTE — MAU Provider Note (Signed)
HPI: Ms. Carmen Palmer is a 22 y.o. female G1P0000 at [redacted]w[redacted]d who presents with complaints of increased discharge. Pt is un convinced that her water has broke, however wants to be sure.  She reports good fetal movement, denies vaginal bleeding, vaginal itching/burning, urinary symptoms, h/a, dizziness, n/v, or fever/chills.    Objective: GENERAL: Well-developed, well-nourished female in no acute distress.  HEENT: Normocephalic, atraumatic.   LUNGS: Effort normal HEART: Regular rate  SKIN: Warm, dry and without erythema PSYCH: Normal mood and affect  Fetal Tracing: Baseline: 140 bpm Variability: moderate  Accelerations: 15x15 Decelerations: none  Toco: None  Speculum exam: Vagina - Small amount of thick, mucus like discharge, no odor Cervix - No contact bleeding, moderate amount of thick, mucus like discharge at cervical os. No pooling of fluid Bimanual exam: Chaperone present for exam.  Dilation: 1.5 Effacement (%): 50 Cervical Position: [place]: Ballotable Exam by:: Konrad Felix NP   RN to call Dr. Leonie Green, NP 01/30/2013 7:21 PM

## 2013-01-31 ENCOUNTER — Inpatient Hospital Stay (HOSPITAL_COMMUNITY)
Admission: AD | Admit: 2013-01-31 | Discharge: 2013-02-04 | DRG: 765 | Disposition: A | Discharge status: Home / Self Care | Payer: Medicaid Other | Source: Ambulatory Visit | Attending: Obstetrics | Admitting: Obstetrics

## 2013-01-31 ENCOUNTER — Encounter (HOSPITAL_COMMUNITY): Payer: Self-pay | Admitting: *Deleted

## 2013-01-31 DIAGNOSIS — O328XX0 Maternal care for other malpresentation of fetus, not applicable or unspecified: Secondary | ICD-10-CM | POA: Diagnosis present

## 2013-01-31 DIAGNOSIS — O1002 Pre-existing essential hypertension complicating childbirth: Secondary | ICD-10-CM | POA: Diagnosis present

## 2013-01-31 DIAGNOSIS — E669 Obesity, unspecified: Secondary | ICD-10-CM | POA: Diagnosis present

## 2013-01-31 HISTORY — DX: Calculus of gallbladder without cholecystitis without obstruction: K80.20

## 2013-01-31 LAB — CBC
MCH: 28.7 pg (ref 26.0–34.0)
MCHC: 34.2 g/dL (ref 30.0–36.0)
MCV: 84 fL (ref 78.0–100.0)
Platelets: 340 10*3/uL (ref 150–400)
RBC: 4.25 MIL/uL (ref 3.87–5.11)
RDW: 13.8 % (ref 11.5–15.5)

## 2013-01-31 MED ORDER — LIDOCAINE HCL (PF) 1 % IJ SOLN: 30.0000 mL | INTRAMUSCULAR | Status: DC | PRN

## 2013-01-31 MED ORDER — OXYTOCIN 40 UNITS IN LACTATED RINGERS INFUSION - SIMPLE MED
1.0000 m[IU]/min | INTRAVENOUS | Status: DC
  Administered 2013-02-01: 2 m[IU]/min via INTRAVENOUS
  Filled 2013-01-31: qty 1000

## 2013-01-31 MED ORDER — CITRIC ACID-SODIUM CITRATE 334-500 MG/5ML PO SOLN
30.0000 mL | ORAL | Status: DC | PRN
  Administered 2013-02-01: 30 mL via ORAL
  Filled 2013-01-31: qty 15

## 2013-01-31 MED ORDER — OXYTOCIN 40 UNITS IN LACTATED RINGERS INFUSION - SIMPLE MED: 62.5000 mL/h | INTRAVENOUS | Status: DC

## 2013-01-31 MED ORDER — OXYTOCIN BOLUS FROM INFUSION: 500.0000 mL | INTRAVENOUS | Status: DC

## 2013-01-31 MED ORDER — IBUPROFEN 600 MG PO TABS: 600.0000 mg | ORAL_TABLET | Freq: Four times a day (QID) | ORAL | Status: DC | PRN

## 2013-01-31 MED ORDER — OXYCODONE-ACETAMINOPHEN 5-325 MG PO TABS: 1.0000 | ORAL_TABLET | ORAL | Status: DC | PRN

## 2013-01-31 MED ORDER — BUTORPHANOL TARTRATE 1 MG/ML IJ SOLN: 1.0000 mg | INTRAMUSCULAR | Status: DC | PRN

## 2013-01-31 MED ORDER — ONDANSETRON HCL 4 MG/2ML IJ SOLN: 4.0000 mg | Freq: Four times a day (QID) | INTRAMUSCULAR | Status: DC | PRN

## 2013-01-31 MED ORDER — FLEET ENEMA 7-19 GM/118ML RE ENEM: 1.0000 | ENEMA | RECTAL | Status: DC | PRN

## 2013-01-31 MED ORDER — LACTATED RINGERS IV SOLN
500.0000 mL | INTRAVENOUS | Status: DC | PRN
  Administered 2013-02-01: 1000 mL via INTRAVENOUS

## 2013-01-31 MED ORDER — LACTATED RINGERS IV SOLN
INTRAVENOUS | Status: DC
  Administered 2013-01-31 – 2013-02-01 (×9): via INTRAVENOUS

## 2013-01-31 MED ORDER — ACETAMINOPHEN 325 MG PO TABS
650.0000 mg | ORAL_TABLET | ORAL | Status: DC | PRN
  Administered 2013-02-01: 650 mg via ORAL
  Filled 2013-01-31: qty 2

## 2013-01-31 MED ORDER — METHYLDOPA 500 MG PO TABS
500.0000 mg | ORAL_TABLET | Freq: Three times a day (TID) | ORAL | Status: DC
  Administered 2013-02-01: 500 mg via ORAL
  Filled 2013-01-31 (×7): qty 1

## 2013-01-31 MED ORDER — TERBUTALINE SULFATE 1 MG/ML IJ SOLN
0.2500 mg | Freq: Once | INTRAMUSCULAR | Status: AC | PRN
  Administered 2013-02-01: 0.25 mg via SUBCUTANEOUS

## 2013-01-31 NOTE — MAU Note (Signed)
Pt presents with c/o leaking fluid since when she was seen this AM.  Pt states she has been leaking all week.  Pt denies bleeding or pih symptoms.  Pt with contractions to her back since 2100.  Pt with + fm.

## 2013-02-01 ENCOUNTER — Encounter (HOSPITAL_COMMUNITY): Payer: Medicaid Other | Admitting: Anesthesiology

## 2013-02-01 ENCOUNTER — Encounter (HOSPITAL_COMMUNITY): Payer: Self-pay | Admitting: Anesthesiology

## 2013-02-01 ENCOUNTER — Inpatient Hospital Stay (HOSPITAL_COMMUNITY): Payer: Medicaid Other | Admitting: Anesthesiology

## 2013-02-01 ENCOUNTER — Encounter (HOSPITAL_COMMUNITY)
Admission: AD | Disposition: A | Discharge status: Home / Self Care | Payer: Self-pay | Source: Ambulatory Visit | Attending: Obstetrics

## 2013-02-01 LAB — TYPE AND SCREEN
ABO/RH(D): O POS
Antibody Screen: NEGATIVE

## 2013-02-01 SURGERY — Surgical Case
Anesthesia: Epidural | Site: Abdomen | Wound class: Clean Contaminated

## 2013-02-01 MED ORDER — PHENYLEPHRINE 40 MCG/ML (10ML) SYRINGE FOR IV PUSH (FOR BLOOD PRESSURE SUPPORT)
PREFILLED_SYRINGE | INTRAVENOUS | Status: AC
  Filled 2013-02-01: qty 5

## 2013-02-01 MED ORDER — MENTHOL 3 MG MT LOZG: 1.0000 | LOZENGE | OROMUCOSAL | Status: DC | PRN

## 2013-02-01 MED ORDER — ACETAMINOPHEN 325 MG PO TABS
ORAL_TABLET | ORAL | Status: AC
  Filled 2013-02-01: qty 2

## 2013-02-01 MED ORDER — LACTATED RINGERS IV SOLN
500.0000 mL | Freq: Once | INTRAVENOUS | Status: AC
  Administered 2013-02-01: 500 mL via INTRAVENOUS

## 2013-02-01 MED ORDER — LIDOCAINE-EPINEPHRINE (PF) 2 %-1:200000 IJ SOLN
INTRAMUSCULAR | Status: AC
  Filled 2013-02-01: qty 20

## 2013-02-01 MED ORDER — DIPHENHYDRAMINE HCL 50 MG/ML IJ SOLN: 25.0000 mg | INTRAMUSCULAR | Status: DC | PRN

## 2013-02-01 MED ORDER — OXYCODONE-ACETAMINOPHEN 5-325 MG PO TABS
1.0000 | ORAL_TABLET | ORAL | Status: DC | PRN
  Administered 2013-02-02: 1 via ORAL
  Administered 2013-02-03: 2 via ORAL
  Administered 2013-02-03: 1 via ORAL
  Administered 2013-02-04 (×2): 2 via ORAL
  Filled 2013-02-01: qty 2
  Filled 2013-02-01: qty 1
  Filled 2013-02-01 (×2): qty 2
  Filled 2013-02-01: qty 1

## 2013-02-01 MED ORDER — SODIUM BICARBONATE 8.4 % IV SOLN
INTRAVENOUS | Status: AC
  Filled 2013-02-01: qty 50

## 2013-02-01 MED ORDER — NALBUPHINE HCL 10 MG/ML IJ SOLN: 5.0000 mg | INTRAMUSCULAR | Status: DC | PRN

## 2013-02-01 MED ORDER — DIBUCAINE 1 % RE OINT: 1.0000 "application " | TOPICAL_OINTMENT | RECTAL | Status: DC | PRN

## 2013-02-01 MED ORDER — ONDANSETRON HCL 4 MG/2ML IJ SOLN: 4.0000 mg | Freq: Three times a day (TID) | INTRAMUSCULAR | Status: DC | PRN

## 2013-02-01 MED ORDER — ONDANSETRON HCL 4 MG/2ML IJ SOLN
INTRAMUSCULAR | Status: AC
  Filled 2013-02-01: qty 2

## 2013-02-01 MED ORDER — PHENYLEPHRINE 40 MCG/ML (10ML) SYRINGE FOR IV PUSH (FOR BLOOD PRESSURE SUPPORT)
80.0000 ug | PREFILLED_SYRINGE | INTRAVENOUS | Status: DC | PRN
  Administered 2013-02-01 (×2): 80 ug via INTRAVENOUS
  Filled 2013-02-01: qty 5

## 2013-02-01 MED ORDER — ONDANSETRON HCL 4 MG PO TABS: 4.0000 mg | ORAL_TABLET | ORAL | Status: DC | PRN

## 2013-02-01 MED ORDER — NALOXONE HCL 0.4 MG/ML IJ SOLN: 0.4000 mg | INTRAMUSCULAR | Status: DC | PRN

## 2013-02-01 MED ORDER — METOCLOPRAMIDE HCL 5 MG/ML IJ SOLN: 10.0000 mg | Freq: Three times a day (TID) | INTRAMUSCULAR | Status: DC | PRN

## 2013-02-01 MED ORDER — ACETAMINOPHEN 325 MG PO TABS
650.0000 mg | ORAL_TABLET | ORAL | Status: DC | PRN
  Administered 2013-02-01: 650 mg via ORAL

## 2013-02-01 MED ORDER — OXYTOCIN 40 UNITS IN LACTATED RINGERS INFUSION - SIMPLE MED: 62.5000 mL/h | INTRAVENOUS | Status: AC

## 2013-02-01 MED ORDER — KETOROLAC TROMETHAMINE 30 MG/ML IJ SOLN
INTRAMUSCULAR | Status: AC
  Administered 2013-02-02: 30 mg via INTRAVENOUS
  Filled 2013-02-01: qty 1

## 2013-02-01 MED ORDER — SCOPOLAMINE 1 MG/3DAYS TD PT72: 1.0000 | MEDICATED_PATCH | Freq: Once | TRANSDERMAL | Status: DC

## 2013-02-01 MED ORDER — MORPHINE SULFATE (PF) 0.5 MG/ML IJ SOLN
INTRAMUSCULAR | Status: DC | PRN
  Administered 2013-02-01 (×3): 1 mg via EPIDURAL

## 2013-02-01 MED ORDER — MORPHINE SULFATE 0.5 MG/ML IJ SOLN
INTRAMUSCULAR | Status: AC
  Filled 2013-02-01: qty 10

## 2013-02-01 MED ORDER — LACTATED RINGERS IV SOLN
INTRAVENOUS | Status: DC
  Administered 2013-02-01: 06:00:00 via INTRAUTERINE

## 2013-02-01 MED ORDER — SODIUM CHLORIDE 0.9 % IJ SOLN: 3.0000 mL | INTRAMUSCULAR | Status: DC | PRN

## 2013-02-01 MED ORDER — PHENYLEPHRINE HCL 10 MG/ML IJ SOLN: INTRAMUSCULAR | Status: DC | PRN

## 2013-02-01 MED ORDER — SIMETHICONE 80 MG PO CHEW
80.0000 mg | CHEWABLE_TABLET | ORAL | Status: DC
  Administered 2013-02-02 – 2013-02-03 (×3): 80 mg via ORAL
  Filled 2013-02-01 (×3): qty 1

## 2013-02-01 MED ORDER — TETANUS-DIPHTH-ACELL PERTUSSIS 5-2.5-18.5 LF-MCG/0.5 IM SUSP: 0.5000 mL | Freq: Once | INTRAMUSCULAR | Status: DC

## 2013-02-01 MED ORDER — WITCH HAZEL-GLYCERIN EX PADS: 1.0000 "application " | MEDICATED_PAD | CUTANEOUS | Status: DC | PRN

## 2013-02-01 MED ORDER — PHENYLEPHRINE 8 MG IN D5W 100 ML (0.08MG/ML) PREMIX OPTIME
INJECTION | INTRAVENOUS | Status: DC | PRN
  Administered 2013-02-01: 30 ug/min via INTRAVENOUS

## 2013-02-01 MED ORDER — KETOROLAC TROMETHAMINE 30 MG/ML IJ SOLN
30.0000 mg | Freq: Four times a day (QID) | INTRAMUSCULAR | Status: AC | PRN
  Administered 2013-02-01: 30 mg via INTRAMUSCULAR

## 2013-02-01 MED ORDER — PRENATAL MULTIVITAMIN CH
1.0000 | ORAL_TABLET | Freq: Every day | ORAL | Status: DC
  Administered 2013-02-02 – 2013-02-04 (×3): 1 via ORAL
  Filled 2013-02-01 (×3): qty 1

## 2013-02-01 MED ORDER — CEFAZOLIN SODIUM-DEXTROSE 2-3 GM-% IV SOLR
2.0000 g | Freq: Three times a day (TID) | INTRAVENOUS | Status: AC
  Administered 2013-02-02 (×2): 2 g via INTRAVENOUS
  Filled 2013-02-01 (×3): qty 50

## 2013-02-01 MED ORDER — ACETAMINOPHEN 325 MG PO TABS: 650.0000 mg | ORAL_TABLET | Freq: Four times a day (QID) | ORAL | Status: DC | PRN

## 2013-02-01 MED ORDER — ALBUTEROL SULFATE HFA 108 (90 BASE) MCG/ACT IN AERS
2.0000 | INHALATION_SPRAY | Freq: Four times a day (QID) | RESPIRATORY_TRACT | Status: DC | PRN
  Administered 2013-02-03 (×2): 2 via RESPIRATORY_TRACT
  Filled 2013-02-01: qty 6.7

## 2013-02-01 MED ORDER — OXYTOCIN 10 UNIT/ML IJ SOLN
INTRAMUSCULAR | Status: AC
  Filled 2013-02-01: qty 4

## 2013-02-01 MED ORDER — FENTANYL 2.5 MCG/ML BUPIVACAINE 1/10 % EPIDURAL INFUSION (WH - ANES)
14.0000 mL/h | INTRAMUSCULAR | Status: DC | PRN
  Administered 2013-02-01 (×2): 14 mL/h via EPIDURAL
  Administered 2013-02-01: 13 mL/h via EPIDURAL
  Filled 2013-02-01 (×3): qty 125

## 2013-02-01 MED ORDER — ZOLPIDEM TARTRATE 5 MG PO TABS: 5.0000 mg | ORAL_TABLET | Freq: Every evening | ORAL | Status: DC | PRN

## 2013-02-01 MED ORDER — EPHEDRINE 5 MG/ML INJ
10.0000 mg | INTRAVENOUS | Status: DC | PRN
  Filled 2013-02-01: qty 4

## 2013-02-01 MED ORDER — TERBUTALINE SULFATE 1 MG/ML IJ SOLN: 0.2500 mg | Freq: Once | INTRAMUSCULAR | Status: DC | PRN

## 2013-02-01 MED ORDER — TERBUTALINE SULFATE 1 MG/ML IJ SOLN
0.2500 mg | Freq: Once | INTRAMUSCULAR | Status: AC | PRN
  Administered 2013-02-01: 0.25 mg via SUBCUTANEOUS
  Filled 2013-02-01: qty 1

## 2013-02-01 MED ORDER — DIPHENHYDRAMINE HCL 25 MG PO CAPS: 25.0000 mg | ORAL_CAPSULE | Freq: Four times a day (QID) | ORAL | Status: DC | PRN

## 2013-02-01 MED ORDER — SODIUM BICARBONATE 8.4 % IV SOLN
INTRAVENOUS | Status: DC | PRN
  Administered 2013-02-01 (×5): 5 mL via EPIDURAL

## 2013-02-01 MED ORDER — OXYTOCIN 10 UNIT/ML IJ SOLN
40.0000 [IU] | INTRAVENOUS | Status: DC | PRN
  Administered 2013-02-01: 40 [IU] via INTRAVENOUS

## 2013-02-01 MED ORDER — PHENYLEPHRINE 40 MCG/ML (10ML) SYRINGE FOR IV PUSH (FOR BLOOD PRESSURE SUPPORT): 80.0000 ug | PREFILLED_SYRINGE | INTRAVENOUS | Status: DC | PRN

## 2013-02-01 MED ORDER — KETOROLAC TROMETHAMINE 30 MG/ML IJ SOLN
30.0000 mg | Freq: Four times a day (QID) | INTRAMUSCULAR | Status: AC | PRN
  Administered 2013-02-02: 30 mg via INTRAVENOUS
  Filled 2013-02-01: qty 1

## 2013-02-01 MED ORDER — METHYLDOPA 500 MG PO TABS
500.0000 mg | ORAL_TABLET | Freq: Three times a day (TID) | ORAL | Status: DC
  Administered 2013-02-01 – 2013-02-04 (×8): 500 mg via ORAL
  Filled 2013-02-01 (×11): qty 1

## 2013-02-01 MED ORDER — SENNOSIDES-DOCUSATE SODIUM 8.6-50 MG PO TABS
2.0000 | ORAL_TABLET | ORAL | Status: DC
  Administered 2013-02-02 – 2013-02-03 (×3): 2 via ORAL
  Filled 2013-02-01 (×3): qty 2

## 2013-02-01 MED ORDER — CEFAZOLIN SODIUM 10 G IJ SOLR: 3.0000 g | Freq: Three times a day (TID) | INTRAMUSCULAR | Status: DC

## 2013-02-01 MED ORDER — LACTATED RINGERS IV SOLN
INTRAVENOUS | Status: DC
  Administered 2013-02-02: 01:00:00 via INTRAVENOUS

## 2013-02-01 MED ORDER — FENTANYL CITRATE 0.05 MG/ML IJ SOLN: 25.0000 ug | INTRAMUSCULAR | Status: DC | PRN

## 2013-02-01 MED ORDER — NALOXONE HCL 1 MG/ML IJ SOLN: 1.0000 ug/kg/h | INTRAVENOUS | Status: DC | PRN

## 2013-02-01 MED ORDER — DIPHENHYDRAMINE HCL 50 MG/ML IJ SOLN: 12.5000 mg | INTRAMUSCULAR | Status: DC | PRN

## 2013-02-01 MED ORDER — EPHEDRINE 5 MG/ML INJ: 10.0000 mg | INTRAVENOUS | Status: DC | PRN

## 2013-02-01 MED ORDER — DEXTROSE 5 % IV SOLN
3.0000 g | Freq: Once | INTRAVENOUS | Status: AC
  Administered 2013-02-01: 3 g via INTRAVENOUS
  Filled 2013-02-01: qty 3000

## 2013-02-01 MED ORDER — PHENYLEPHRINE HCL 10 MG/ML IJ SOLN
INTRAMUSCULAR | Status: DC | PRN
  Administered 2013-02-01: 80 ug via INTRAVENOUS

## 2013-02-01 MED ORDER — MEPERIDINE HCL 25 MG/ML IJ SOLN: 6.2500 mg | INTRAMUSCULAR | Status: DC | PRN

## 2013-02-01 MED ORDER — SIMETHICONE 80 MG PO CHEW: 80.0000 mg | CHEWABLE_TABLET | ORAL | Status: DC | PRN

## 2013-02-01 MED ORDER — IBUPROFEN 600 MG PO TABS
600.0000 mg | ORAL_TABLET | Freq: Four times a day (QID) | ORAL | Status: DC
  Administered 2013-02-02 – 2013-02-04 (×9): 600 mg via ORAL
  Filled 2013-02-01 (×9): qty 1

## 2013-02-01 MED ORDER — DIPHENHYDRAMINE HCL 25 MG PO CAPS
25.0000 mg | ORAL_CAPSULE | ORAL | Status: DC | PRN
  Filled 2013-02-01: qty 1

## 2013-02-01 MED ORDER — SIMETHICONE 80 MG PO CHEW
80.0000 mg | CHEWABLE_TABLET | Freq: Three times a day (TID) | ORAL | Status: DC
  Administered 2013-02-02 – 2013-02-04 (×7): 80 mg via ORAL
  Filled 2013-02-01 (×7): qty 1

## 2013-02-01 MED ORDER — LANOLIN HYDROUS EX OINT: 1.0000 "application " | TOPICAL_OINTMENT | CUTANEOUS | Status: DC | PRN

## 2013-02-01 MED ORDER — TERBUTALINE SULFATE 1 MG/ML IJ SOLN
INTRAMUSCULAR | Status: AC
  Administered 2013-02-01: 0.25 mg via SUBCUTANEOUS
  Filled 2013-02-01: qty 1

## 2013-02-01 MED ORDER — ONDANSETRON HCL 4 MG/2ML IJ SOLN
INTRAMUSCULAR | Status: DC | PRN
  Administered 2013-02-01: 4 mg via INTRAVENOUS

## 2013-02-01 MED ORDER — PHENYLEPHRINE HCL 10 MG/ML IJ SOLN
INTRAMUSCULAR | Status: AC
  Filled 2013-02-01: qty 1

## 2013-02-01 MED ORDER — ONDANSETRON HCL 4 MG/2ML IJ SOLN: 4.0000 mg | INTRAMUSCULAR | Status: DC | PRN

## 2013-02-01 SURGICAL SUPPLY — 32 items
CLAMP CORD UMBIL (MISCELLANEOUS) IMPLANT
CLOTH BEACON ORANGE TIMEOUT ST (SAFETY) ×2 IMPLANT
DERMABOND ADVANCED (GAUZE/BANDAGES/DRESSINGS) ×1
DERMABOND ADVANCED .7 DNX12 (GAUZE/BANDAGES/DRESSINGS) ×1 IMPLANT
DRAPE LG THREE QUARTER DISP (DRAPES) ×4 IMPLANT
DRSG OPSITE POSTOP 4X10 (GAUZE/BANDAGES/DRESSINGS) ×2 IMPLANT
DURAPREP 26ML APPLICATOR (WOUND CARE) ×2 IMPLANT
ELECT REM PT RETURN 9FT ADLT (ELECTROSURGICAL) ×2
ELECTRODE REM PT RTRN 9FT ADLT (ELECTROSURGICAL) ×1 IMPLANT
EXTRACTOR VACUUM M CUP 4 TUBE (SUCTIONS) IMPLANT
GLOVE BIO SURGEON STRL SZ8.5 (GLOVE) ×2 IMPLANT
GOWN PREVENTION PLUS XLARGE (GOWN DISPOSABLE) ×2 IMPLANT
GOWN PREVENTION PLUS XXLARGE (GOWN DISPOSABLE) ×2 IMPLANT
KIT ABG SYR 3ML LUER SLIP (SYRINGE) ×2 IMPLANT
NEEDLE HYPO 25X5/8 SAFETYGLIDE (NEEDLE) ×2 IMPLANT
NS IRRIG 1000ML POUR BTL (IV SOLUTION) ×2 IMPLANT
PACK C SECTION WH (CUSTOM PROCEDURE TRAY) ×2 IMPLANT
PAD OB MATERNITY 4.3X12.25 (PERSONAL CARE ITEMS) ×2 IMPLANT
RETAINER VISCERAL (MISCELLANEOUS) ×2 IMPLANT
STAPLER VISISTAT 35W (STAPLE) ×2 IMPLANT
SUT CHROMIC 0 CT 802H (SUTURE) ×2 IMPLANT
SUT CHROMIC 1 CTX 36 (SUTURE) ×4 IMPLANT
SUT CHROMIC 2 0 SH (SUTURE) ×2 IMPLANT
SUT GUT PLAIN 0 CT-3 TAN 27 (SUTURE) IMPLANT
SUT MON AB 4-0 PS1 27 (SUTURE) ×2 IMPLANT
SUT VIC AB 0 CT1 18XCR BRD8 (SUTURE) IMPLANT
SUT VIC AB 0 CT1 8-18 (SUTURE)
SUT VIC AB 0 CTX 36 (SUTURE) ×2
SUT VIC AB 0 CTX36XBRD ANBCTRL (SUTURE) ×2 IMPLANT
TOWEL OR 17X24 6PK STRL BLUE (TOWEL DISPOSABLE) ×2 IMPLANT
TRAY FOLEY CATH 14FR (SET/KITS/TRAYS/PACK) ×2 IMPLANT
WATER STERILE IRR 1000ML POUR (IV SOLUTION) ×2 IMPLANT

## 2013-02-01 NOTE — Lactation Note (Signed)
This note was copied from the chart of Carmen Palmer. Lactation Consultation Note Initial visit with [facility] resources given and discussed with mom and fob.  Baby is 5 hours old and has no breast feedings yet. Baby is asleep in the bassinet.  Encouraged mom to feed with cues and skin to skin as much as they want to help with milk supply.  Mom noticed some leakage of colostrum during pregnancy.  Moms nipples are flat and somewhat inverted with compression on right nipple.  Left areola is not compressible with nipple flat also.  Encouraged to use hand pump after breast massage and hand expression prior to latching baby.  Hand expresson demonstrated, but no visible colostrum at this attempt.   Mom plans to try breast feeding to see if it will work, but also plans to bo and formula feed.   Mom to call for latch assistance as needed.   Patient Name: Carmen Palmer [id number] Date: 02/01/2013 Reason for consult: Initial assessment   Maternal Data Has patient been taught Hand Expression?: Yes Does the patient have breastfeeding experience prior to this delivery?: No  Feeding    LATCH Score/Interventions                      Lactation Tools Discussed/Used     Consult Status Consult Status: Follow-up Date: 02/02/13 Follow-up type: In-patient    Palmer, Carmen Merles 02/01/2013, 10:30 PM

## 2013-02-01 NOTE — Addendum Note (Signed)
Addendum created 02/01/13 2006 by Orlie Pollen, CRNA   Modules edited: Anesthesia Medication Administration

## 2013-02-01 NOTE — Progress Notes (Signed)
Dr Gaynell Face,     This patient 's  Morbid obesity represents a major impediment in her anesthetic care. If she has need of a CS for delivery ,to the extent possible, we should plan this during a time when we have extra personal to care for her. This is not someone who can be easily handled in a STAT situation.  Please discuss with the OB anesthesiologist in the morning.

## 2013-02-01 NOTE — Progress Notes (Signed)
Patient ID: Carmen Palmer, female   DOB: 1990/06/03, 23 y.o.   MRN: [id number] Patient is still 4 cm 100% vertex -3 she was on admission yesterday her labor has been adequate and she's had to 2 minutes of Pitocin and had to be discontinued because of late decelerations and variable decelerations which she has from time to time she has received terbutaline twice today and him she delivered by C-section for failure to progress in labor nonreassuring fetal heart rate tracing

## 2013-02-01 NOTE — Lactation Note (Signed)
This note was copied from the chart of Carmen Amarie Sandora. Lactation Consultation Note Called to room, baby is hungry per parents.  MBU Rn assisted with hand pump and latch attempt, diaper change.  Nipple shield #24 used on right nipple.  Baby latched for moments with no real suck pattern.  Baby does not look to be cuing for feeding.  Encouraged skin to skin and #20 Nipple Shield left to try during the night if needed.     Patient Name: Carmen Palmer [id number] Date: 02/01/2013 Reason for consult: Initial assessment   Maternal Data Has patient been taught Hand Expression?: Yes Does the patient have breastfeeding experience prior to this delivery?: No  Feeding    LATCH Score/Interventions Latch: Repeated attempts needed to sustain latch, nipple held in mouth throughout feeding, stimulation needed to elicit sucking reflex.  Audible Swallowing: None  Type of Nipple: Flat  Comfort (Breast/Nipple): Soft / non-tender     Hold (Positioning): No assistance needed to correctly position infant at breast.  LATCH Score: 6  Lactation Tools Discussed/Used     Consult Status Consult Status: Follow-up Date: 02/02/13 Follow-up type: In-patient    Shoptaw, Arvella Merles 02/01/2013, 11:17 PM

## 2013-02-01 NOTE — Progress Notes (Signed)
Patient ID: Carmen Palmer, female   DOB: 06-14-1990, 22 y.o.   MRN: [id number] 5 AM started having some late decelerations the Pitocin was discontinued given oxygen and   terbutaline 0.25 subcutaneous did not stopped and the amnioinfusion began to restart the Pitocin at 8 h

## 2013-02-01 NOTE — H&P (Signed)
This is Dr. Francoise Ceo dictating the history and physical on  Carmen Palmer she's a 22 year old gravida 1 at 39 weeks due 02/08/2013 negative GBS followed by maternal-fetal medicine because of chronic hypertension she is on Aldomet 500 every 8 hours her membranes ruptured spontaneously at 10 AM on the 1026 and she did not contact anyone and did not come to the hospital until last night cervix 4 cm 80% vertex -2-3 fluid clear she has an epidural and a scalp lead and IUPC blood pressures been normal and she is also on 8 milliunits of Pitocin Past medical history negative Past surgical history negative Social history negative System review noncontributory Physical massively obese female in labor HEENT negative Lungs clear to P&A Heart regular rhythm no murmurs no gallops Breasts pendulous Abdomen obese Pelvic as described above Extremities negative

## 2013-02-01 NOTE — Anesthesia Preprocedure Evaluation (Addendum)
Anesthesia Evaluation  Patient identified by MRN, date of birth, ID band Patient awake    Reviewed: Allergy & Precautions, H&P , Patient's Chart, lab work & pertinent test results  Airway Mallampati: IV TM Distance: >3 FB Neck ROM: Limited   Comment: Very serious limitations to neck mobility and airway access due to Morbid Obesity Dental  (+) Teeth Intact   Pulmonary asthma ,  breath sounds clear to auscultation        Cardiovascular hypertension, On Medications Rhythm:regular Rate:Normal     Neuro/Psych negative neurological ROS  negative psych ROS   GI/Hepatic Neg liver ROS,   Endo/Other  Morbid obesity  Renal/GU negative Renal ROS  negative genitourinary   Musculoskeletal negative musculoskeletal ROS (+)   Abdominal (+) + obese,   Peds  Hematology   Anesthesia Other Findings       Reproductive/Obstetrics (+) Pregnancy                         Anesthesia Physical Anesthesia Plan  ASA: III and emergent  Anesthesia Plan: Epidural   Post-op Pain Management:    Induction:   Airway Management Planned: Natural Airway  Additional Equipment:   Intra-op Plan:   Post-operative Plan:   Informed Consent: I have reviewed the patients History and Physical, chart, labs and discussed the procedure including the risks, benefits and alternatives for the proposed anesthesia with the patient or authorized representative who has indicated his/her understanding and acceptance.   Dental Advisory Given  Plan Discussed with: Anesthesiologist, CRNA and Surgeon  Anesthesia Plan Comments: (This patients Obesity represents a major impediment to her anesthetic care. She does not move in the bed well under her own power, and will be very difficult with profound motor block. Her fat pad on her upper back will make positioning for GA very difficult and her airway will be challenging. In addition, the amount  of adipose covering her back make epidural access challenging, I will use the longest needle I have and chose the area in the natural indentation of her adipose as my access site. The epidural catheter may work itself out because it is taped in a crevasse, and the dressing is unstable.  Labs checked- platelets confirmed with RN in room. Fetal heart tracing, per RN, reported to be stable enough for sitting procedure. Discussed epidural, and patient consents to the procedure:  included risk of possible headache,backache, failed block, allergic reaction, and nerve injury. This patient was asked if she had any questions or concerns before the procedure started. )      Anesthesia Quick Evaluation

## 2013-02-01 NOTE — Op Note (Signed)
preop diagnosis failure to progress in in labornonreassuring fetal heart rate prolonged ruptured membranes Postop diagnosis classical C-section with breech footling breech delivery Surgeon Dr. Francoise Ceo First assistant Dr. Coral Ceo Anesthesia epidural Procedure after the epidural dosed patient in the supine position abdomen prepped and draped  Bladder emptied  with a   Foley catheter a midline supra-umbilical incision was made up to the xiphoid process and carried down to the fascia fascia cleaned and incised the length of the incision a classical incision was made on the uterus the placenta was anterior the memb fluid  slightly meconium-stained she was delivered of a female footling breech extraction Apgar 3 and 9 cord pH 7.17 placenta was anterior removed and sent to pathology the team was in attendance the uterus was cleaned with dry laps and then the uterus closed in 2 layers with continuous suture of #1 chromic hemostasis was satisfactory tubes and ovaries normal the fascia closed with continuous suture of #1 PDS subcutaneous tissue closed with 0 plain interrupted and the skin closed with staples blood loss was 600 cc patient tolerated the procedure well taken to the recovery room in good condition

## 2013-02-01 NOTE — Consult Note (Addendum)
Neonatology Note:  Attendance at C-section:  I was asked by Dr. Marshall to attend this primary C/S at term due to NRFHR and FTP. The mother is a G1P0 O pos, GBS neg with chronic HTN, on Aldomet, and obseity. ROM 31 hours prior to delivery, fluid clear. Mother afebrile during labor. Prior to C/S there wee deep FHR decelerations noted on monitoring. Infant was floppy and apneic at birth, but HR was always normal. We bulb suctioned for a large amount of thin, light green fluid, then gave vigorous stimulation, without response. PPV was then applied for a total of 2 min., stopping briefly to reposition and bulb suction again. He gradually got pinker and, at 3 min, he cried for the first time. He regained normal muscle tone quickly, by 4-5 minutes. Ap 3/9. Lungs clear to ausc in DR. To CN to care of Pediatrician.  Alaine Loughney C. Steven Veazie, MD  

## 2013-02-01 NOTE — Anesthesia Postprocedure Evaluation (Signed)
  Anesthesia Post-op Note  Patient: Carmen Palmer  Procedure(s) Performed: Procedure(s): CESAREAN SECTION (N/A)  Patient Location: PACU  Anesthesia Type:Epidural  Level of Consciousness: awake, alert  and oriented  Airway and Oxygen Therapy: Patient Spontanous Breathing  Post-op Pain: none  Post-op Assessment: Post-op Vital signs reviewed, Patient's Cardiovascular Status Stable, Respiratory Function Stable, Patent Airway, No signs of Nausea or vomiting, Pain level controlled, No headache, No backache, No residual numbness and No residual motor weakness  Post-op Vital Signs: Reviewed and stable  Complications: No apparent anesthesia complications

## 2013-02-01 NOTE — Anesthesia Procedure Notes (Signed)
Epidural Patient location during procedure: OB  Preanesthetic Checklist Completed: patient identified, site marked, surgical consent, pre-op evaluation, timeout performed, IV checked, risks and benefits discussed and monitors and equipment checked  Epidural Patient position: sitting Prep: site prepped and draped and DuraPrep Patient monitoring: continuous pulse ox and blood pressure Approach: midline Injection technique: LOR air  Needle:  Needle type: Tuohy  Needle gauge: 17 G Needle length: 9 cm and 9 Needle insertion depth: 11 cm Catheter type: closed end flexible Catheter size: 19 Gauge Catheter at skin depth: 18 cm Test dose: negative  Assessment Events: blood not aspirated, injection not painful, no injection resistance, negative IV test and no paresthesia  Additional Notes Dosing of Epidural:  1st dose, through catheter ............................................Marland Kitchen epi 1:200K + Xylocaine 40 mg  2nd dose, through catheter, after waiting 3 minutes...Marland KitchenMarland Kitchenepi 1:200K + Xylocaine 60 mg      ( 2% Xylo charted as a single dose in Epic Meds for ease of charting; actual dosing was fractionated as above, for saftey's sake)  As each dose occurred, patient was free of IV sx; and patient exhibited no evidence of SA injection.  Patient is more comfortable after epidural dosed. Please see RN's note for documentation of vital signs,and FHR which are stable.  Patient reminded not to try to ambulate with numb legs, and that an RN must be present when she attempts to get up.

## 2013-02-01 NOTE — Transfer of Care (Signed)
Immediate Anesthesia Transfer of Care Note  Patient: Carmen Palmer  Procedure(s) Performed: Procedure(s): CESAREAN SECTION (N/A)  Patient Location: PACU  Anesthesia Type:Epidural  Level of Consciousness: awake  Airway & Oxygen Therapy: Patient Spontanous Breathing  Post-op Assessment: Report given to PACU RN  Post vital signs: Reviewed and stable  Complications: No apparent anesthesia complications

## 2013-02-02 ENCOUNTER — Inpatient Hospital Stay (HOSPITAL_COMMUNITY): Admission: RE | Admit: 2013-02-02 | Payer: Medicaid Other | Source: Ambulatory Visit

## 2013-02-02 ENCOUNTER — Encounter (HOSPITAL_COMMUNITY): Payer: Self-pay | Admitting: Obstetrics

## 2013-02-02 LAB — CBC
MCHC: 34.3 g/dL (ref 30.0–36.0)
MCV: 83.4 fL (ref 78.0–100.0)
Platelets: 246 10*3/uL (ref 150–400)
RBC: 3.74 MIL/uL — ABNORMAL LOW (ref 3.87–5.11)
RDW: 13.9 % (ref 11.5–15.5)
WBC: 19 10*3/uL — ABNORMAL HIGH (ref 4.0–10.5)

## 2013-02-02 NOTE — Lactation Note (Signed)
This note was copied from the chart of Carmen Palmer. Lactation Consultation Note  Patient Name: Carmen Palmer [id number] Date: 02/02/2013 Reason for consult: Follow-up assessment;Difficult latch Assisted Mom with latching baby in football hold. Mom's nipples are flat, slightly thick, nipples inverts with compression. Awakened baby and with full LC assist baby latched. Demonstrated a good rhythmic suck with stimulation, scant colostrum visible in the nipple shield. Lots of colostrum with hand expression after baby was at the breast. Baby placed STS on FOB. Encouraged Mom to BF with feeding ques. Ask for assist with feedings. Plan to set up DEBP later when Mom feels better to help stimulate milk production.   Maternal Data    Feeding Feeding Type: Breast Fed Length of feed: 15 min  LATCH Score/Interventions Latch: Repeated attempts needed to sustain latch, nipple held in mouth throughout feeding, stimulation needed to elicit sucking reflex. (used #24 nipple shield) Intervention(s): Adjust position;Assist with latch  Audible Swallowing: A few with stimulation  Type of Nipple: Flat Intervention(s): Hand pump  Comfort (Breast/Nipple): Soft / non-tender     Hold (Positioning): Full assist, staff holds infant at breast Intervention(s): Breastfeeding basics reviewed;Support Pillows;Skin to skin  LATCH Score: 5  Lactation Tools Discussed/Used Tools: Nipple Carmen Palmer;Pump Nipple shield size: 24 Breast pump type: Double-Electric Breast Pump Initiated by:: KG Date initiated:: 02/02/13   Consult Status Consult Status: Follow-up Date: 02/02/13 Follow-up type: In-patient    Alfred Levins 02/02/2013, 11:13 AM

## 2013-02-02 NOTE — Progress Notes (Signed)
CTSP with bloody drainage from the incision.  Blood admixed with fat necrosis pooling in umbilicus.  Staples removed from inferior margin of incision.  Silver nitrate applied.  Pressure applied.  Markedly less oozing.  Irrigated.  Staples replaced. Pressure dressing applied.  -->abdominal binder

## 2013-02-02 NOTE — Progress Notes (Signed)
Notified Dr. Tamela Oddi of patient large amount of bleeding oozing through pressure dressing from incisional site.  Dr. Tamela Oddi came to see patient. New pressure dressing on and Clean Dry and Intact.

## 2013-02-02 NOTE — Progress Notes (Signed)
UR completed 

## 2013-02-02 NOTE — Lactation Note (Addendum)
This note was copied from the chart of Carmen Palmer. Lactation Consultation Note  Patient Name: Carmen Palmer [id number] Date: 02/02/2013 Reason for consult: Follow-up assessment;Difficult latch Mom has not attempted to BF since my last visit this morning. Stressed importance of waking baby to BF and keeping baby STS to become interested in BF. Mom was pumping the right breast when I arrived, scant amount of colostrum visible. Assisted Mom with positioning and latching baby to left breast using #24 nipple shield. Mom does not appear to be engaged with breastfeeding although she is more interested than this morning. Mom reports she wants to breast and bottle feed. Advised Mom that she could supplement after BF if she would like. Guidelines for supplementing with breastfeeding reviewed with Mom. Also advised Mom due to the difficult latch she could pump and bottle feed if this would be easier for her. Mom would not commit to either plan at this visit. She reports she will think about what she would like to do to feed her baby. Baby was at the breast for at least 25 minutes at this visit. Demonstrated to Mom how to keep baby awake at the breast. Stressed importance of her participating to keep baby breastfeeding well. Mom also may chose to formula and bottle feed. Advised at this time to BF with feeding ques, waking baby as needed. Let FOB give supplement while Mom post pumps to encourage milk production. Ask for assist with feedings. Demonstrated pump and how to clean pump parts. Storage reviewed.   Maternal Data    Feeding Feeding Type: Breast Fed Length of feed: 25 min  LATCH Score/Interventions Latch: Repeated attempts needed to sustain latch, nipple held in mouth throughout feeding, stimulation needed to elicit sucking reflex. (using #24 nipple shield) Intervention(s): Adjust position;Assist with latch;Breast massage;Breast compression  Audible Swallowing: A few with stimulation  Type  of Nipple: Flat Intervention(s): Double electric pump  Comfort (Breast/Nipple): Soft / non-tender     Hold (Positioning): Full assist, staff holds infant at breast Intervention(s): Breastfeeding basics reviewed;Support Pillows;Position options;Skin to skin  LATCH Score: 5  Lactation Tools Discussed/Used Tools: Nipple Dorris Carnes;Pump Nipple shield size: 24 Breast pump type: Double-Electric Breast Pump   Consult Status Consult Status: Follow-up Date: 02/03/13 Follow-up type: In-patient    Alfred Levins 02/02/2013, 5:34 PM

## 2013-02-02 NOTE — Anesthesia Postprocedure Evaluation (Signed)
Anesthesia Post Note  Patient: Carmen Palmer  Procedure(s) Performed: Procedure(s) (LRB): CESAREAN SECTION (N/A)  Anesthesia type: Epidural  Patient location: Mother/Baby  Post pain: Pain level controlled  Post assessment: Post-op Vital signs reviewed  Last Vitals:  Filed Vitals:   02/02/13 0629  BP: 111/71  Pulse: 112  Temp: 36.9 C  Resp: 24    Post vital signs: Reviewed  Level of consciousness: awake  Complications: No apparent anesthesia complications

## 2013-02-02 NOTE — Lactation Note (Signed)
This note was copied from the chart of Boy Natelie Clinkscales. Lactation Consultation Note  Patient Name: Boy Maida Widger [id number] Date: 02/02/2013 Reason for consult: Follow-up assessment;Difficult latch.  Mom was seen and assisted with latching baby to breast earlier today by Freda Jackson but at this time, her RN, Val reports that mom has decided to only bottle feed with formula.   Maternal Data    Feeding Feeding Type: Breast Fed Length of feed: 25 min  LATCH Score/Interventions Latch: Repeated attempts needed to sustain latch, nipple held in mouth throughout feeding, stimulation needed to elicit sucking reflex. (using #24 nipple shield) Intervention(s): Adjust position;Assist with latch;Breast massage;Breast compression  Audible Swallowing: A few with stimulation  Type of Nipple: Flat Intervention(s): Double electric pump  Comfort (Breast/Nipple): Soft / non-tender     Hold (Positioning): Full assist, staff holds infant at breast Intervention(s): Breastfeeding basics reviewed;Support Pillows;Position options;Skin to skin  LATCH Score: 5  Lactation Tools Discussed/Used Tools: Nipple Dorris Carnes;Pump Nipple shield size: 24 Breast pump type: Double-Electric Breast Pump   Consult Status Consult Status: Follow-up Date: 02/03/13 Follow-up type: In-patient  No follow-up needed unles mom requests - changed to bottle/formula feeding only  Warrick Parisian [facility] 02/02/2013, 7:06 PM

## 2013-02-02 NOTE — Progress Notes (Signed)
Patient ID: Carmen Palmer, female   DOB: Sep 28, 1990, 22 y.o.   MRN: [id number] Postop day 1 Vital signs normal Good bowel sounds Lochia moderate Legs negative doing well

## 2013-02-03 MED ORDER — INFLUENZA VAC SPLIT QUAD 0.5 ML IM SUSP: 0.5000 mL | INTRAMUSCULAR | Status: DC | PRN

## 2013-02-03 MED ORDER — PNEUMOCOCCAL VAC POLYVALENT 25 MCG/0.5ML IJ INJ: 0.5000 mL | INJECTION | INTRAMUSCULAR | Status: DC | PRN

## 2013-02-03 MED ORDER — TETANUS-DIPHTH-ACELL PERTUSSIS 5-2.5-18.5 LF-MCG/0.5 IM SUSP: 0.5000 mL | INTRAMUSCULAR | Status: DC | PRN

## 2013-02-03 NOTE — Progress Notes (Signed)
Patient ID: Carmen Palmer, female   DOB: 04/23/1990, 22 y.o.   MRN: [id number] Was instructed to Vital signs normal Good bowel sounds Passing flatus and had a bowel movement legs negative doing well

## 2013-02-04 NOTE — Progress Notes (Signed)
Patient ID: Carmen Palmer, female   DOB: [birth year], 22 y.o.   MRN: [id number] Postop day 3 Vital signs normal And dressing removed incision clean and dry Good bowel sounds Lochia moderate Is legs negative a him patient is to see me on Tuesday to   remove staples she's doing well

## 2013-02-04 NOTE — Discharge Summary (Signed)
Obstetric Discharge Summary Reason for Admission: onset of labor Prenatal Procedures: none Intrapartum Procedures: cesarean: low cervical, transverse Postpartum Procedures: none Complications-Operative and Postpartum: none Hemoglobin  Date Value Range Status  02/02/2013 10.7* 12.0 - 15.0 g/dL Final     HCT  Date Value Range Status  02/02/2013 31.2* 36.0 - 46.0 % Final    Physical Exam:  General: alert Lochia: appropriate Uterine Fundus: firm Incision: healing well DVT Evaluation: No evidence of DVT seen on physical exam.  Discharge Diagnoses: Term Pregnancy-delivered  Discharge Information: Date: 02/04/2013 Activity: pelvic rest Diet: routine Medications: Percocet Condition: stable Instructions: refer to practice specific booklet Discharge to: home Follow-up Information   Follow up with Kamber Vignola A, MD. Schedule an appointment as soon as possible for a visit in 7 days.   Specialty:  Obstetrics and Gynecology   Contact information:   [address] [place] [address] [phone]       Newborn Data: Live born female  Birth Weight: 6 lb 4.9 oz (2860 g) APGAR: 3, 9  Home with mother.  Rahmah Mccamy A 02/04/2013, 7:03 AM

## 2013-02-09 ENCOUNTER — Inpatient Hospital Stay (HOSPITAL_COMMUNITY): Payer: Medicaid Other

## 2013-02-09 ENCOUNTER — Inpatient Hospital Stay (HOSPITAL_COMMUNITY)
Admission: AD | Admit: 2013-02-09 | Discharge: 2013-02-10 | DRG: 776 | Disposition: A | Discharge status: Home / Self Care | Payer: Medicaid Other | Source: Ambulatory Visit | Attending: Obstetrics | Admitting: Obstetrics

## 2013-02-09 ENCOUNTER — Encounter (HOSPITAL_COMMUNITY): Payer: Self-pay | Admitting: *Deleted

## 2013-02-09 DIAGNOSIS — R1084 Generalized abdominal pain: Secondary | ICD-10-CM

## 2013-02-09 DIAGNOSIS — O864 Pyrexia of unknown origin following delivery: Secondary | ICD-10-CM | POA: Diagnosis present

## 2013-02-09 DIAGNOSIS — R5082 Postprocedural fever: Secondary | ICD-10-CM

## 2013-02-09 DIAGNOSIS — E669 Obesity, unspecified: Secondary | ICD-10-CM | POA: Diagnosis present

## 2013-02-09 DIAGNOSIS — Z6841 Body Mass Index (BMI) 40.0 and over, adult: Secondary | ICD-10-CM

## 2013-02-09 DIAGNOSIS — R197 Diarrhea, unspecified: Secondary | ICD-10-CM

## 2013-02-09 DIAGNOSIS — IMO0001 Reserved for inherently not codable concepts without codable children: Secondary | ICD-10-CM | POA: Diagnosis present

## 2013-02-09 DIAGNOSIS — O9279 Other disorders of lactation: Secondary | ICD-10-CM | POA: Diagnosis present

## 2013-02-09 DIAGNOSIS — K560 Paralytic ileus: Secondary | ICD-10-CM | POA: Diagnosis present

## 2013-02-09 LAB — URINE MICROSCOPIC-ADD ON

## 2013-02-09 LAB — CBC WITH DIFFERENTIAL/PLATELET
Basophils Absolute: 0 10*3/uL (ref 0.0–0.1)
Eosinophils Absolute: 0 10*3/uL (ref 0.0–0.7)
Lymphocytes Relative: 12 % (ref 12–46)
Lymphs Abs: 1.9 10*3/uL (ref 0.7–4.0)
MCH: 28.7 pg (ref 26.0–34.0)
MCHC: 34.4 g/dL (ref 30.0–36.0)
MCV: 83.5 fL (ref 78.0–100.0)
Neutro Abs: 12.2 10*3/uL — ABNORMAL HIGH (ref 1.7–7.7)
Neutrophils Relative %: 76 % (ref 43–77)
Platelets: 361 10*3/uL (ref 150–400)
RBC: 3.76 MIL/uL — ABNORMAL LOW (ref 3.87–5.11)
RDW: 13.7 % (ref 11.5–15.5)
WBC: 16.1 10*3/uL — ABNORMAL HIGH (ref 4.0–10.5)

## 2013-02-09 LAB — COMPREHENSIVE METABOLIC PANEL
ALT: 8 U/L (ref 0–35)
Albumin: 2.4 g/dL — ABNORMAL LOW (ref 3.5–5.2)
Alkaline Phosphatase: 76 U/L (ref 39–117)
CO2: 23 mEq/L (ref 19–32)
GFR calc Af Amer: 65 mL/min — ABNORMAL LOW (ref >90)
GFR calc non Af Amer: 56 mL/min — ABNORMAL LOW (ref >90)
Glucose, Bld: 97 mg/dL (ref 70–99)
Potassium: 3.7 mEq/L (ref 3.5–5.1)
Sodium: 140 mEq/L (ref 135–145)
Total Protein: 6.5 g/dL (ref 6.0–8.3)

## 2013-02-09 LAB — URINALYSIS, ROUTINE W REFLEX MICROSCOPIC
Nitrite: NEGATIVE
Protein, ur: 30 mg/dL — AB
Specific Gravity, Urine: 1.02 (ref 1.005–1.030)
Urobilinogen, UA: 1 mg/dL (ref 0.0–1.0)

## 2013-02-09 LAB — URIC ACID: Uric Acid, Serum: 9 mg/dL — ABNORMAL HIGH (ref 2.4–7.0)

## 2013-02-09 MED ORDER — PIPERACILLIN-TAZOBACTAM 3.375 G IVPB
3.3750 g | Freq: Three times a day (TID) | INTRAVENOUS | Status: DC
  Administered 2013-02-09 – 2013-02-10 (×3): 3.375 g via INTRAVENOUS
  Filled 2013-02-09 (×5): qty 50

## 2013-02-09 MED ORDER — SODIUM CHLORIDE 0.9 % IV SOLN
INTRAVENOUS | Status: DC
  Administered 2013-02-09: 20:00:00 via INTRAVENOUS

## 2013-02-09 MED ORDER — ACETAMINOPHEN 325 MG PO TABS
650.0000 mg | ORAL_TABLET | Freq: Four times a day (QID) | ORAL | Status: DC | PRN
Start: 2013-02-09 — End: 2013-02-10
  Administered 2013-02-09 – 2013-02-10 (×2): 650 mg via ORAL
  Filled 2013-02-09 (×2): qty 2

## 2013-02-09 MED ORDER — INFLUENZA VAC SPLIT QUAD 0.5 ML IM SUSP: 0.5000 mL | INTRAMUSCULAR | Status: DC

## 2013-02-09 MED ORDER — HYDROMORPHONE HCL PF 1 MG/ML IJ SOLN
1.0000 mg | Freq: Once | INTRAMUSCULAR | Status: AC
  Administered 2013-02-09: 1 mg via INTRAVENOUS
  Filled 2013-02-09: qty 1

## 2013-02-09 MED ORDER — ZOLPIDEM TARTRATE 5 MG PO TABS: 5.0000 mg | ORAL_TABLET | Freq: Every evening | ORAL | Status: DC | PRN

## 2013-02-09 MED ORDER — PNEUMOCOCCAL VAC POLYVALENT 25 MCG/0.5ML IJ INJ
0.5000 mL | INJECTION | INTRAMUSCULAR | Status: DC
  Filled 2013-02-09: qty 0.5

## 2013-02-09 MED ORDER — OXYCODONE-ACETAMINOPHEN 5-325 MG PO TABS
1.0000 | ORAL_TABLET | ORAL | Status: DC | PRN
  Administered 2013-02-10: 2 via ORAL
  Administered 2013-02-10: 1 via ORAL
  Administered 2013-02-10: 2 via ORAL
  Filled 2013-02-09 (×2): qty 2
  Filled 2013-02-09 (×2): qty 1

## 2013-02-09 MED ORDER — DEXTROSE 5 % IV SOLN: 2.0000 g | Freq: Two times a day (BID) | INTRAVENOUS | Status: DC

## 2013-02-09 MED ORDER — LABETALOL HCL 200 MG PO TABS
200.0000 mg | ORAL_TABLET | Freq: Three times a day (TID) | ORAL | Status: DC
  Administered 2013-02-09 – 2013-02-10 (×3): 200 mg via ORAL
  Filled 2013-02-09: qty 1
  Filled 2013-02-09: qty 2
  Filled 2013-02-09: qty 1
  Filled 2013-02-09 (×2): qty 2
  Filled 2013-02-09: qty 1

## 2013-02-09 MED ORDER — ENOXAPARIN SODIUM 40 MG/0.4ML ~~LOC~~ SOLN
40.0000 mg | SUBCUTANEOUS | Status: DC
  Administered 2013-02-09: 40 mg via SUBCUTANEOUS
  Filled 2013-02-09 (×2): qty 0.4

## 2013-02-09 MED ORDER — SODIUM CHLORIDE 0.9 % IV SOLN: INTRAVENOUS | Status: DC

## 2013-02-09 NOTE — MAU Note (Signed)
C/S on 10/27.  Decreased appetite, diarrhea since yesterday, abd cramping.  Temp @ home 102.9 @ 1600 today, seen by [facility] nurse, states BP was also high.  States she needs her staples taken out.  Denies bleeding.

## 2013-02-09 NOTE — MAU Provider Note (Signed)
History     CSN: [id number]  Arrival date and time: 02/09/13 1800   First Provider Initiated Contact with Patient 02/09/13 1845      Chief Complaint  Patient presents with  . Abdominal Pain  . Fever   HPI This is a 22 y.o. female who presents with fever, decreased appetite, diarrhea, abdominal pain and breast pain. Had a classical C/S for breech presentation after decelerations and failure to progress after 30 hrs of ruptured membranes. Does have a history of hypertension in pregnancy and has a headache today. Admits to not eating all day. Stopped breastfeeding in hospital  RN Note: C/S on 10/27. Decreased appetite, diarrhea since yesterday, abd cramping. Temp @ home 102.9 @ 1600 today, seen by [facility] nurse, states BP was also high. States she needs her staples taken out. Denies bleeding.      OB History   Grav Para Term Preterm Abortions TAB SAB Ect Mult Living   1 1 1  0 0 0 0 0 0 1      Past Medical History  Diagnosis Date  . Hypertension   . Asthma   . Morbid obesity with BMI of 70 and over, adult   . Gallstones     Past Surgical History  Procedure Laterality Date  . No past surgeries    . Cesarean section N/A 02/01/2013    Procedure: CESAREAN SECTION;  Surgeon: Kathreen Cosier, MD;  Location: [facility];  Service: Obstetrics;  Laterality: N/A;    Family History  Problem Relation Age of Onset  . Hypertension Mother   . Heart disease Father   . Hypertension Father     History  Substance Use Topics  . Smoking status: Never Smoker   . Smokeless tobacco: Never Used  . Alcohol Use: No    Allergies: No Known Allergies  Prescriptions prior to admission  Medication Sig Dispense Refill  . albuterol (PROVENTIL HFA;VENTOLIN HFA) 108 (90 BASE) MCG/ACT inhaler Inhale 2 puffs into the lungs every 6 (six) hours as needed for shortness of breath.        Review of Systems  Constitutional: Positive for fever, chills and malaise/fatigue.  Eyes: Negative for  blurred vision and double vision.  Respiratory: Negative for sputum production and shortness of breath.   Gastrointestinal: Positive for abdominal pain and diarrhea. Negative for nausea, vomiting and constipation.  Genitourinary: Negative for dysuria.  Neurological: Positive for weakness and headaches. Negative for focal weakness.   Physical Exam   Blood pressure 149/102, pulse 126, temperature 103.1 F (39.5 C), temperature source Oral, resp. rate 26, SpO2 98.00%, not currently breastfeeding.  Physical Exam  Constitutional: She is oriented to person, place, and time. She appears well-developed. No distress (but appears uncomfortable).  HENT:  Head: Normocephalic.  Cardiovascular: Normal rate.   Respiratory: Effort normal. No respiratory distress (but is tachypneic). She has no wheezes. She has no rales. She exhibits no tenderness.  GI: Soft. She exhibits distension. She exhibits no mass. There is tenderness. There is no rebound and no guarding.  Honeycomb dressing removed.  Staples intact.  No erethema or drainage.  Genitourinary: Vaginal discharge (scant lochia) found.  Musculoskeletal: Normal range of motion. She exhibits edema (1+2+).  Neurological: She is alert and oriented to person, place, and time. She displays normal reflexes.  Skin: Skin is warm and dry.  Psychiatric: She has a normal mood and affect.  Breasts tender bilaterally, some induration scattered bilaterally, no overt engorgement  MAU Course  Procedures  MDM  Discussed with Dr Gaynell Face.  Will check labs and admit if WBC count is high.  Results for orders placed during the hospital encounter of 02/09/13 (from the past 24 hour(s))  URINALYSIS, ROUTINE W REFLEX MICROSCOPIC     Status: Abnormal   Collection Time    02/09/13  6:25 PM      Result Value Range   Color, Urine YELLOW  YELLOW   APPearance CLEAR  CLEAR   Specific Gravity, Urine 1.020  1.005 - 1.030   pH 6.0  5.0 - 8.0   Glucose, UA NEGATIVE  NEGATIVE  mg/dL   Hgb urine dipstick MODERATE (*) NEGATIVE   Bilirubin Urine NEGATIVE  NEGATIVE   Ketones, ur 15 (*) NEGATIVE mg/dL   Protein, ur 30 (*) NEGATIVE mg/dL   Urobilinogen, UA 1.0  0.0 - 1.0 mg/dL   Nitrite NEGATIVE  NEGATIVE   Leukocytes, UA MODERATE (*) NEGATIVE  URINE MICROSCOPIC-ADD ON     Status: Abnormal   Collection Time    02/09/13  6:25 PM      Result Value Range   Squamous Epithelial / LPF FEW (*) RARE   WBC, UA 21-50  <3 WBC/hpf   RBC / HPF 21-50  <3 RBC/hpf   Bacteria, UA FEW (*) RARE  CBC WITH DIFFERENTIAL     Status: Abnormal   Collection Time    02/09/13  8:50 PM      Result Value Range   WBC 16.1 (*) 4.0 - 10.5 K/uL   RBC 3.76 (*) 3.87 - 5.11 MIL/uL   Hemoglobin 10.8 (*) 12.0 - 15.0 g/dL   HCT 62.1 (*) 30.8 - 65.7 %   MCV 83.5  78.0 - 100.0 fL   MCH 28.7  26.0 - 34.0 pg   MCHC 34.4  30.0 - 36.0 g/dL   RDW 84.6  96.2 - 95.2 %   Platelets 361  150 - 400 K/uL   Neutrophils Relative % 76  43 - 77 %   Neutro Abs 12.2 (*) 1.7 - 7.7 K/uL   Lymphocytes Relative 12  12 - 46 %   Lymphs Abs 1.9  0.7 - 4.0 K/uL   Monocytes Relative 12  3 - 12 %   Monocytes Absolute 1.9 (*) 0.1 - 1.0 K/uL   Eosinophils Relative 0  0 - 5 %   Eosinophils Absolute 0.0  0.0 - 0.7 K/uL   Basophils Relative 0  0 - 1 %   Basophils Absolute 0.0  0.0 - 0.1 K/uL  COMPREHENSIVE METABOLIC PANEL     Status: Abnormal   Collection Time    02/09/13  8:50 PM      Result Value Range   Sodium 140  135 - 145 mEq/L   Potassium 3.7  3.5 - 5.1 mEq/L   Chloride 103  96 - 112 mEq/L   CO2 23  19 - 32 mEq/L   Glucose, Bld 97  70 - 99 mg/dL   BUN 10  6 - 23 mg/dL   Creatinine, Ser 8.41 (*) 0.50 - 1.10 mg/dL   Calcium 8.4  8.4 - 32.4 mg/dL   Total Protein 6.5  6.0 - 8.3 g/dL   Albumin 2.4 (*) 3.5 - 5.2 g/dL   AST 10  0 - 37 U/L   ALT 8  0 - 35 U/L   Alkaline Phosphatase 76  39 - 117 U/L   Total Bilirubin 0.6  0.3 - 1.2 mg/dL   GFR calc non Af Amer 56 (*) >90 mL/min  GFR calc Af Amer 65 (*) >90  mL/min  URIC ACID     Status: Abnormal   Collection Time    02/09/13  8:50 PM      Result Value Range   Uric Acid, Serum 9.0 (*) 2.4 - 7.0 mg/dL   Dg Chest 2 View  16/04[birth year]958   CLINICAL DATA:  Shortness of breath, history of recent C-section, history of asthma  EXAM: CHEST  2 VIEW  COMPARISON:  None.  FINDINGS: Transverse heart size is at the upper limits of normal. Mediastinal silhouette is within normal limits.  The lungs are normally inflated. No focal infiltrate to suggest acute infectious pneumonitis is identified. There is no pulmonary edema or pleural effusion. No pneumothorax.  No acute osseous abnormality identified.  IMPRESSION: No active cardiopulmonary disease.   Electronically Signed   By: Rise Mu M.D.   On: 02/09/2013 20:49   Dg Abd 1 View  02/09/2013   CLINICAL DATA:  Abdominal pain, diarrhea and history of recent C-section on 02/01/2013  EXAM: ABDOMEN - 1 VIEW  COMPARISON:  Prior chest x-ray 02/09/2013  FINDINGS: Nonspecific bowel gas pattern with loops of dilated large and small bowel throughout the abdomen. Relative paucity of gas overlying the anatomic pelvis. Midline surgical staples suggest laparotomy. No acute osseous abnormality.  IMPRESSION: Dilated large and small bowel in the abdomen most consistent with postoperative ileus.  Relative paucity of gas in the region of the rectum.   Electronically Signed   By: Malachy Moan M.D.   On: 02/09/2013 20:56   Assessment and Plan  A:  Postoperative fever      Probable ileus      Elevated Creatinine      Hypertension       P:  Admit per Dr Gaynell Face      Antibiotics      Labetalol tid      Lactation consult  [facility] 02/09/2013, 7:17 PM

## 2013-02-10 NOTE — Progress Notes (Signed)
Per pharm pts urine culture results came back positive for ecoli. Informed Dr. Gaynell Face.  Per md, pt not to fill the clinda prescription and inform the patient he will call an antibiotic into her pharmacy that will be effective.

## 2013-02-10 NOTE — Discharge Summary (Signed)
   8 days post C-section was admitted last night and temp  102.1 and elevated blood pressure she is a chronic hypertensive and was started on labetalol 200 mg 3 times a day and also was lost in 3.75 mg every 8 hours chest x-ray was negative flat plate of abdomen showed some ileus she is moving her bowels staples were removed today and the patient will be discharged on clindamycin 300 every 6 hours for 10 days Percocet for pain and labetalol 200 mg 2 times a day she is to see me in a week to be checked her

## 2013-02-10 NOTE — Progress Notes (Signed)
Patient ID: Carmen Palmer, female   DOB: 1990-07-24, 22 y.o.   MRN: [id number] She has been afebrile since admission her incision looks fine except for the bottom 1 inch where the skin is open the staples were removed and Steri-Strips applied she has good bowel sounds and has no complaints in she remains afebrile today than she can be discharged tonight her urine was negative and her blood pressures are basically normal on labetalol 200 by mouth 3 times a day and

## 2013-02-10 NOTE — H&P (Signed)
This is Dr. Francoise Ceo dictating the history and physical on  Carmen Palmer she's a 22 year old chronic hypertension hypertensive 380 pound female who had a classical incision for C-section on 1027 because of prolonged ruptured membranes and failure to progress during her pregnancy she was on Aldomet 500 every 8 hours and she was discharged on the third post op day she returned last night with a temp of 102.1 hemoglobin 10 white count 15,000 down from 19,000 when she was hospitalized she's not breast-feeding and she complained of breast being engorged her chest x-ray was negative she also had diarrhea for 2 days had been having regular bowel movements before and and flat plate and upright of abdomen showed postop mild ileus her Sinemet was normal except for creatinine 1.1 and her urine protein was 30 she was started on labetalol 200 mg by mouth q. 8 hours and is [place] 3.75 IV every 8 hours and clear liquids she is also on Lovenox 40 subcutaneous daily Past medical history history of chronic hypertension Past surgical history she had a classical C-section on 02/01/2013 Social history negative System review noncontributory Physical exam massively obese female weighing 380 pounds not in any distress HEENT negative Lungs clear to P&A Heart regular rhythm no murmurs no gallops Abdomen obese him her incision is in midline supraumbilical incision to the xiphoid and umbilicus and the incision is clean Good bowel sounds and extremities negative

## 2013-02-10 NOTE — Progress Notes (Signed)
Ur chart review completed.  

## 2013-02-10 NOTE — Progress Notes (Signed)
Patient states she has changed her mind regarding her vaccinations. She wants to wait until her follow up appt with Dr. Gaynell Face

## 2013-02-10 NOTE — Progress Notes (Signed)
Patient ID: Carmen Palmer, female   DOB: Oct 15, 1990, 22 y.o.   MRN: [id number] Blood  Pressure 138/83 Good bowel sounds nontender breasts engorged Discharge home on clindamycin 300 mg by mouth q. 6 hours Percocet for pain Labetalol 200 mg 3 times a day to see me in one week

## 2013-02-11 LAB — URINE CULTURE: Colony Count: 70000

## 2013-07-04 ENCOUNTER — Encounter (HOSPITAL_COMMUNITY): Payer: Self-pay | Admitting: Emergency Medicine

## 2013-07-04 DIAGNOSIS — Z79899 Other long term (current) drug therapy: Secondary | ICD-10-CM | POA: Insufficient documentation

## 2013-07-04 DIAGNOSIS — I10 Essential (primary) hypertension: Secondary | ICD-10-CM | POA: Insufficient documentation

## 2013-07-04 DIAGNOSIS — Z3202 Encounter for pregnancy test, result negative: Secondary | ICD-10-CM | POA: Insufficient documentation

## 2013-07-04 DIAGNOSIS — K2970 Gastritis, unspecified, without bleeding: Secondary | ICD-10-CM | POA: Insufficient documentation

## 2013-07-04 DIAGNOSIS — J45909 Unspecified asthma, uncomplicated: Secondary | ICD-10-CM | POA: Insufficient documentation

## 2013-07-04 DIAGNOSIS — Z6841 Body Mass Index (BMI) 40.0 and over, adult: Secondary | ICD-10-CM | POA: Insufficient documentation

## 2013-07-04 DIAGNOSIS — K2990 Gastroduodenitis, unspecified, without bleeding: Principal | ICD-10-CM

## 2013-07-04 LAB — CBC WITH DIFFERENTIAL/PLATELET
Basophils Absolute: 0 10*3/uL (ref 0.0–0.1)
Basophils Relative: 0 % (ref 0–1)
Eosinophils Absolute: 0.1 10*3/uL (ref 0.0–0.7)
Eosinophils Relative: 1 % (ref 0–5)
HEMATOCRIT: 39.6 % (ref 36.0–46.0)
HEMOGLOBIN: 13.8 g/dL (ref 12.0–15.0)
LYMPHS PCT: 33 % (ref 12–46)
Lymphs Abs: 3 10*3/uL (ref 0.7–4.0)
MCH: 29.1 pg (ref 26.0–34.0)
MCHC: 34.8 g/dL (ref 30.0–36.0)
MCV: 83.4 fL (ref 78.0–100.0)
MONOS PCT: 8 % (ref 3–12)
Monocytes Absolute: 0.8 10*3/uL (ref 0.1–1.0)
NEUTROS ABS: 5.3 10*3/uL (ref 1.7–7.7)
NEUTROS PCT: 58 % (ref 43–77)
Platelets: 380 10*3/uL (ref 150–400)
RBC: 4.75 MIL/uL (ref 3.87–5.11)
RDW: 13.5 % (ref 11.5–15.5)
WBC: 9.1 10*3/uL (ref 4.0–10.5)

## 2013-07-04 LAB — URINALYSIS, ROUTINE W REFLEX MICROSCOPIC
BILIRUBIN URINE: NEGATIVE
Glucose, UA: NEGATIVE mg/dL
HGB URINE DIPSTICK: NEGATIVE
Ketones, ur: NEGATIVE mg/dL
Leukocytes, UA: NEGATIVE
NITRITE: NEGATIVE
Protein, ur: NEGATIVE mg/dL
SPECIFIC GRAVITY, URINE: 1.021 (ref 1.005–1.030)
Urobilinogen, UA: 0.2 mg/dL (ref 0.0–1.0)
pH: 7 (ref 5.0–8.0)

## 2013-07-04 LAB — PREGNANCY, URINE: PREG TEST UR: NEGATIVE

## 2013-07-04 NOTE — ED Notes (Signed)
The pt is c/o epigastric pain all day getting worse tonight.  No nv or diarrhea.  She delivered in dec 2014 and she was diagnosed with gall stones then.  lmp friday

## 2013-07-05 ENCOUNTER — Emergency Department (HOSPITAL_COMMUNITY)
Admission: EM | Admit: 2013-07-05 | Discharge: 2013-07-05 | Disposition: A | Discharge status: Home / Self Care | Payer: Medicaid Other | Attending: Emergency Medicine | Admitting: Emergency Medicine

## 2013-07-05 DIAGNOSIS — K2970 Gastritis, unspecified, without bleeding: Secondary | ICD-10-CM

## 2013-07-05 LAB — COMPREHENSIVE METABOLIC PANEL
ALBUMIN: 3.9 g/dL (ref 3.5–5.2)
ALK PHOS: 65 U/L (ref 39–117)
ALT: 19 U/L (ref 0–35)
AST: 30 U/L (ref 0–37)
BILIRUBIN TOTAL: 0.7 mg/dL (ref 0.3–1.2)
BUN: 14 mg/dL (ref 6–23)
CHLORIDE: 101 meq/L (ref 96–112)
CO2: 29 meq/L (ref 19–32)
CREATININE: 0.94 mg/dL (ref 0.50–1.10)
Calcium: 9.3 mg/dL (ref 8.4–10.5)
GFR calc Af Amer: >90 mL/min (ref >90)
GFR, EST NON AFRICAN AMERICAN: 85 mL/min — AB (ref >90)
Glucose, Bld: 94 mg/dL (ref 70–99)
POTASSIUM: 4.2 meq/L (ref 3.7–5.3)
Sodium: 144 mEq/L (ref 137–147)
Total Protein: 8 g/dL (ref 6.0–8.3)

## 2013-07-05 LAB — LIPASE, BLOOD: Lipase: 14 U/L (ref 11–59)

## 2013-07-05 MED ORDER — GI COCKTAIL ~~LOC~~
30.0000 mL | Freq: Once | ORAL | Status: AC
  Administered 2013-07-05: 30 mL via ORAL
  Filled 2013-07-05: qty 30

## 2013-07-05 MED ORDER — FAMOTIDINE 20 MG PO TABS
20.0000 mg | ORAL_TABLET | Freq: Once | ORAL | Status: AC
  Administered 2013-07-05: 20 mg via ORAL
  Filled 2013-07-05: qty 1

## 2013-07-05 MED ORDER — FAMOTIDINE 20 MG PO TABS: 20.0000 mg | ORAL_TABLET | Freq: Two times a day (BID) | ORAL | Status: AC

## 2013-07-05 NOTE — ED Notes (Signed)
Pt alert, NAD, calm, interactive, resps e/u, speaking in clear complete sentences, c/o epigastric CP, "better than before", 2/10, comes and goes, no dyspnea noted, (denies: cough, congestion, cold sx, fever, nv, sob, dizziness or other sx), pain does not move, worse with inspiration and bending over. Last ate 1900, last BM 1000 (soft/no form).

## 2013-07-05 NOTE — ED Notes (Signed)
"  feels better", removed self from monitor, sitting in chair with infant in car seat next to her, pt watching TV, alert, NAD, calm, denies pain or other sx.

## 2013-07-05 NOTE — Discharge Instructions (Signed)
°Emergency Department Resource Guide °1) Find a Doctor and Pay Out of Pocket °Although you won't have to find out who is covered by your insurance plan, it is a good idea to ask around and get recommendations. You will then need to call the office and see if the doctor you have chosen will accept you as a new patient and what types of options they offer for patients who are self-pay. Some doctors offer discounts or will set up payment plans for their patients who do not have insurance, but you will need to ask so you aren't surprised when you get to your appointment. ° °2) Contact Your Local Health Department °Not all health departments have doctors that can see patients for sick visits, but many do, so it is worth a call to see if yours does. If you don't know where your local health department is, you can check in your phone book. The CDC also has a tool to help you locate your state's health department, and many state websites also have listings of all of their local health departments. ° °3) Find a Walk-in Clinic °If your illness is not likely to be very severe or complicated, you may want to try a walk in clinic. These are popping up all over the country in pharmacies, drugstores, and shopping centers. They're usually staffed by nurse practitioners or physician assistants that have been trained to treat common illnesses and complaints. They're usually fairly quick and inexpensive. However, if you have serious medical issues or chronic medical problems, these are probably not your best option. ° °No Primary Care Doctor: °- Call [facility] Connect at  832-8000 - they can help you locate a primary care doctor that  accepts your insurance, provides certain services, etc. °- Physician Referral Service- 1-800-533-3463 ° °Chronic Pain Problems: °Organization         Address  Phone   Notes  °[facility]  (336) 297-2271 Patients need to be referred by their primary care doctor.  ° °Medication  Assistance: °Organization         Address  Phone   Notes  °[place] [facility] [address] °[place], [place] [address] (336) 641-8030 --Must be a resident of [place] °-- Must have NO insurance coverage whatsoever (no Medicaid/ Medicare, etc.) °-- The pt. MUST have a primary care doctor that directs their care regularly and follows them in the community °  °[facility]  (866) 331-1348   °[employer]  (888) 892-1162   ° °Agencies that provide inexpensive medical care: °Organization         Address  Phone   Notes  °[facility]  (336) 832-8035   °[facility]    (336) 832-7272   °[facility] [address] °[place], [place] [address] (336) 832-4777   °[facility] [address], °[place] (336) 271-4999   °[facility]    (336) 373-0678   °[facility]    (336) 272-1050   °[facility] ° [address], [place] Phone:  (336) 832-4444, Fax:  (336) 832-4440 Hours of Operation:  9 am - 6 pm, M-F.  Also accepts Medicaid/Medicare and self-pay.  °[facility] ° [address], [place] Phone: (336) 832-3150, Fax: (336) [phone]. Hours of Operation:  8:30 am - 5:30 pm, M-F.  Also accepts Medicaid and self-pay.  °[facility] [place] [address], [place] Phone: (336) 878-6027   °[facility] [address], [place], [place] (336)723-1848, Ext. 123 Mondays & Thursdays: 7-9 AM.  First 15 patients are seen on a first come, first serve basis. °  ° °Medicaid-accepting [place] Providers: ° °Organization         Address  Phone   Notes  °[facility] [address], [place] (336) 641-2100 Also accepts self-pay patients.  °[facility] [address], [place] ° (336) 856-9996   °[facility] [address], [place]  (336) 288-8857   °[facility] [address] [place] [address], [place] (336) 299-7000   °Veita Bland [address], [place]  ° (336) 373-1557 Only accepts [place] Access Medicaid patients after they have their name applied to their card.  ° °Self-Pay (no insurance) in [place]: ° °Organization         Address  Phone   Notes  °Sickle Cell Patients, [facility] [address], [place] (336) 832-1970   °[facility] [address], [place] (336) 832-4400   °[facility] ° [address], [place] (336) 992-4800   °[facility]/Dr. Osei-Bonsu ° [address] [place] [address], [place] or [address], [place] (336) [phone] Phone number for both [place] and [place] locations is the same.  °[facility] [address], [place] (336) 299-0000   °[facility] [address] [place] [address], [place] or [address] (336) 852-7530 °(336) 878-2260   °[facility] [address], [place] (336) 350-1642, phone; (336) 294-5005, fax Sees patients 1st and 3rd Saturday of every month.  Must not qualify for public or private insurance (i.e. Medicaid, Medicare, [place] Health Choice, Veterans' Benefits) • Household income should be no more than 200% of the poverty level •The clinic cannot treat you if you are pregnant or think you are pregnant • Sexually transmitted diseases are not treated at the clinic.  ° ° °Dental Care: °Organization         Address  Phone  Notes  °[place] [facility] [address], [place] (336) 641-6152 Accepts children up to age 21 who are enrolled in Medicaid or [place] Health Choice; pregnant women with a Medicaid card; and children who have applied for Medicaid or [place] Health Choice, but were declined, whose parents can pay a reduced fee at time of service.  °[place]  [facility] [place]  [address], [place] (336) 641-7733 Accepts children up to age 21 who are enrolled in Medicaid or [place] Health Choice; pregnant women with a Medicaid card; and children who have applied for Medicaid or [place] Health Choice, but were declined, whose parents can pay a reduced fee at time of service.  °[facility] ° [address], [place] (336) 641-4533 Patients are seen by appointment only. Walk-ins are not accepted. [facility] will see patients 18 years of age and older. °Monday - Tuesday (8am-5pm) °Most Wednesdays (8:30-5pm) °$30 per visit, cash only  °[facility] ° [address], [place] (336) 641-4533 Patients are seen by appointment only. Walk-ins are not accepted. [facility] will see patients 18 years of age and older. °One   Wednesday Evening (Monthly: Volunteer Based).  $30 per visit, cash only  °[facility]  (919) 537-3737 for adults; Children under age 4, call Graduate Pediatric Dentistry at (919) [phone]. Children aged 4-14, please call (919) [phone] to request a pediatric application. ° Dental services are provided in all areas of dental care including fillings, crowns and bridges, complete and partial dentures, implants, gum treatment, root canals, and extractions. Preventive care is also provided. Treatment is provided to both adults and children. °Patients are selected via a lottery and there is often a waiting list. °  °[facility] [address], °[place] ° (336) 763-8833 [email or web address] °  °[facility] [address], [place], [place] (336)723-1848, Ext. 123 Second and Fourth Thursday of each month, opens at 6:30 AM; Clinic ends at 9 AM.  Patients are seen on a first-come first-served basis, and a limited number are seen during each clinic.  ° °[facility] ° [address], [place], [place] (336) 723-7904    Eligibility Requirements °You must have lived in [place], [place], or [place] counties for at least the last three months. °  You cannot be eligible for state or federal sponsored healthcare insurance, including Veterans Administration, Medicaid, or Medicare. °  You generally cannot be eligible for healthcare insurance through your employer.  °  How to apply: °Eligibility screenings are held every Tuesday and Wednesday afternoon from 1:00 pm until 4:00 pm. You do not need an appointment for the interview!  °[facility] [address], [place], [place] 336-631-2330   °[facility]  336-342-8273   °[facility]  336-703-3100   °[facility]  336-570-6415   ° °Behavioral Health Resources in the Community: °Intensive Outpatient Programs °Organization         Address  Phone  Notes  °[facility] [address], [place], [place] 336-878-6098   °[facility] [address], [place], [place] 336-832-9800   °[facility] [address], [place], [place] ° 336-882-2125   °[facility] [address],  °[place], [place] 1-800-853-5163 or 336-641-4981   °Substance Abuse Resources °Organization         Address  Phone  Notes  °[facility]  336-882-2125   °[facility]  336-784-9470   °[facility]  336-285-9073   °[facility]  336-845-3988   °Residential & Outpatient Substance Abuse Program  1-800-659-3381   °Psychological Services °Organization         Address  Phone  Notes  °[facility]  336- 832-9600   °[facility]  336- 378-7881   °[facility] [address], [place] 1-800-853-5163 or 336-641-4981   ° °Mobile Crisis Teams °Organization         Address  Phone  Notes  °[facility], Mobile Crisis Care Unit  1-877-626-1772   °Assertive °[facility] ° [address]  [place], [place] 336-834-9664   °Sharon DeEsch [address] °[place] 336-554-5454   ° °Self-Help/Support Groups °Organization         Address  Phone             Notes  °[facility] [place] - variety of support groups  336- 373-1402 Call for more information  °Narcotics Anonymous (NA), [facility] [address], °[place]  2 meetings at this location  ° °  Residential Treatment Programs °Organization         Address  Phone  Notes  °[facility] [address],    °[place]  1-866-801-8205   °[facility] ° [address], [place], [place] 704-293-8524   °[facility] [address], [place] 336-845-3988 Admissions: 8am-3pm M-F  °[facility] [address]    °[place], [place] 336-841-1104   °[facility] [address], [place], [place] 336-379-7146   °[facility] [address]  °[place], [place] 336-285-9073   °[facility] - Intensive Outpatient [address], [place], [place] 336-852-3033   °[facility] [address]  °[place], [place] 1-877-615-2722 or 336-784-9470   °[facility] [address] [place], [place] 336-227-7417 Accepts Medicaid  °[facility] [address]  °[place] 1-800-659-3381 Substance Abuse/Addiction Treatment  ° °[place] Behavioral Health Resources °Organization         Address  Phone  Notes  °[facility]  (888) 581-9988   °Julie Brannon, PhD [address] [place], [place]   (336) 349-5553 or (336) 951-0000   °[facility]   [address] °[place], [place] (336) 349-4454   °[facility] [address], [place], [place] (336) 342-8316 Insurance/Medicaid/sponsorship through [facility]  °[facility] [address]                                    [place],  (336) 342-8316 Therapy/tele-psych/case    °[facility] [address]  ° [place],  (336) 349-2233    °Dr. Arfeen  (336) 349-4544   °[facility] [place]  [employer] [place] [facility] 1) [address],  °2) [address], [place] °3)  [address], [place] (336) 349-3220 °(336) 342-7768 ° °(336) 342-8140   °[place] Child Abuse [facility] (336) 342-1394 or (336) 342-3537 (After Hours)    ° ° °

## 2013-07-05 NOTE — ED Notes (Signed)
Given Rx x1.  

## 2013-07-05 NOTE — ED Provider Notes (Signed)
CSN: [id number]     Arrival date & time 07/04/13  2258 History   First MD Initiated Contact with Patient 07/05/13 [phone]     Chief Complaint  Patient presents with  . epigastric pain      (Consider location/radiation/quality/duration/timing/severity/associated sxs/prior Treatment) HPI Comments: 23 year old female who is obese, recent history of a right upper quadrant ultrasound in October of 2014 which showed small gallstones and sludge but no signs of cholecystitis. She presents with epigastric pain that started this evening, located in the epigastrium without radiation except for some radiation to the back. There was no nausea or vomiting, no changes in bowel habits, no urinary changes. She had no fevers or chills. This pain did start earlier in the day, went away but came back this evening. She had spaghetti for dinner, she did have this in the past in October of last year when she had a greasy meal prior to the onset of the symptoms. She did not followup with general surgery and has not had any symptoms since that time until today. At this time her symptoms are mild to moderate. She took Tylenol prior to arrival without improvement  The history is provided by the patient, medical records and a relative.    Past Medical History  Diagnosis Date  . Hypertension   . Asthma   . Morbid obesity with BMI of 70 and over, adult   . Gallstones    Past Surgical History  Procedure Laterality Date  . No past surgeries    . Cesarean section N/A 02/01/2013    Procedure: CESAREAN SECTION;  Surgeon: Kathreen Cosier, MD;  Location: [facility];  Service: Obstetrics;  Laterality: N/A;   Family History  Problem Relation Age of Onset  . Hypertension Mother   . Heart disease Father   . Hypertension Father    History  Substance Use Topics  . Smoking status: Never Smoker   . Smokeless tobacco: Never Used  . Alcohol Use: No   OB History   Grav Para Term Preterm Abortions TAB SAB Ect Mult Living   1 1  1  0 0 0 0 0 0 1     Review of Systems  All other systems reviewed and are negative.      Allergies  Review of patient's allergies indicates no known allergies.  Home Medications   Current Outpatient Rx  Name  Route  Sig  Dispense  Refill  . acetaminophen (TYLENOL) 500 MG tablet   Oral   Take 1,000 mg by mouth every 6 (six) hours as needed for moderate pain.         Marland Kitchen PRESCRIPTION MEDICATION   Oral   Take 1 tablet by mouth daily. Takes 2 blood pressure medications daily-unknown name         . albuterol (PROVENTIL HFA;VENTOLIN HFA) 108 (90 BASE) MCG/ACT inhaler   Inhalation   Inhale 2 puffs into the lungs every 6 (six) hours as needed for shortness of breath.         . famotidine (PEPCID) 20 MG tablet   Oral   Take 1 tablet (20 mg total) by mouth 2 (two) times daily.   30 tablet   0    BP 125/80  Pulse 93  Temp(Src) 98.3 F (36.8 C) (Oral)  Resp 22  Ht 5\' 2"  (1.575 m)  Wt 358 lb 3 oz (162.473 kg)  BMI 65.50 kg/m2  SpO2 98%  LMP 07/02/2013 Physical Exam  Nursing note and vitals reviewed. Constitutional:  She appears well-developed and well-nourished. No distress.  HENT:  Head: Normocephalic and atraumatic.  Mouth/Throat: Oropharynx is clear and moist. No oropharyngeal exudate.  Eyes: Conjunctivae and EOM are normal. Pupils are equal, round, and reactive to light. Right eye exhibits no discharge. Left eye exhibits no discharge. No scleral icterus.  Neck: Normal range of motion. Neck supple. No JVD present. No thyromegaly present.  Cardiovascular: Normal rate, regular rhythm, normal heart sounds and intact distal pulses.  Exam reveals no gallop and no friction rub.   No murmur heard. Pulmonary/Chest: Effort normal and breath sounds normal. No respiratory distress. She has no wheezes. She has no rales.  Abdominal: Soft. Bowel sounds are normal. She exhibits no distension and no mass. There is tenderness ( Reproducible tenderness in the epigastrium, no right  upper quadrant tenderness, no Murphy's sign, no pain in the lower abdomen, no tenderness or guarding to the lower abdomen.).  Musculoskeletal: Normal range of motion. She exhibits no edema and no tenderness.  Lymphadenopathy:    She has no cervical adenopathy.  Neurological: She is alert. Coordination normal.  Skin: Skin is warm and dry. No rash noted. No erythema.  Psychiatric: She has a normal mood and affect. Her behavior is normal.    ED Course  Procedures (including critical care time) Labs Review Labs Reviewed  COMPREHENSIVE METABOLIC PANEL - Abnormal; Notable for the following:    GFR calc non Af Amer 85 (*)    All other components within normal limits  URINALYSIS, ROUTINE W REFLEX MICROSCOPIC - Abnormal; Notable for the following:    APPearance CLOUDY (*)    All other components within normal limits  CBC WITH DIFFERENTIAL  LIPASE, BLOOD  PREGNANCY, URINE   Imaging Review No results found.     MDM   Final diagnoses:  Gastritis    The patient is morbidly obese, she does not have a fever, she is not nauseated or vomiting and has no pain in the right upper quadrant. My concern lies more with a gastric source rather than a cholecystic source, labs have been normal including a leukocytosis and no transaminitis, normal bilirubin and normal lipase. Urinalysis is also normal with no pregnancy, no infection. We'll try GI cocktail, the patient is otherwise benign in appearance and has a nonsurgical abdomen  Patient reexamined after GI cocktail, she is essentially pain-free, labs reviewed with the patient which are normal, she appears stable for discharge and will be started on acid medication. Patient expresses her understanding to the indications for return.  Doubt cholecystitis given location of pain and history of ultrasound which was non-confirmatory of acute disease   Meds given in ED:  Medications  famotidine (PEPCID) tablet 20 mg (not administered)  gi cocktail  (Maalox,Lidocaine,Donnatal) (30 mLs Oral Given 07/05/13 0118)    New Prescriptions   FAMOTIDINE (PEPCID) 20 MG TABLET    Take 1 tablet (20 mg total) by mouth 2 (two) times daily.      Vida RollerBrian D Chala Gul, MD 07/05/13 [phone]

## 2014-02-07 ENCOUNTER — Encounter (HOSPITAL_COMMUNITY): Payer: Self-pay | Admitting: Emergency Medicine

## 2015-03-10 ENCOUNTER — Emergency Department (HOSPITAL_BASED_OUTPATIENT_CLINIC_OR_DEPARTMENT_OTHER): Payer: Medicaid Other

## 2015-03-10 ENCOUNTER — Encounter (HOSPITAL_BASED_OUTPATIENT_CLINIC_OR_DEPARTMENT_OTHER): Payer: Self-pay | Admitting: *Deleted

## 2015-03-10 ENCOUNTER — Emergency Department (HOSPITAL_BASED_OUTPATIENT_CLINIC_OR_DEPARTMENT_OTHER)
Admission: EM | Admit: 2015-03-10 | Discharge: 2015-03-11 | Disposition: A | Discharge status: Home / Self Care | Payer: Medicaid Other | Attending: Emergency Medicine | Admitting: Emergency Medicine

## 2015-03-10 DIAGNOSIS — R600 Localized edema: Secondary | ICD-10-CM | POA: Diagnosis not present

## 2015-03-10 DIAGNOSIS — Z8719 Personal history of other diseases of the digestive system: Secondary | ICD-10-CM | POA: Insufficient documentation

## 2015-03-10 DIAGNOSIS — Z9889 Other specified postprocedural states: Secondary | ICD-10-CM | POA: Diagnosis not present

## 2015-03-10 DIAGNOSIS — I509 Heart failure, unspecified: Secondary | ICD-10-CM | POA: Diagnosis not present

## 2015-03-10 DIAGNOSIS — R1084 Generalized abdominal pain: Secondary | ICD-10-CM | POA: Insufficient documentation

## 2015-03-10 DIAGNOSIS — J45909 Unspecified asthma, uncomplicated: Secondary | ICD-10-CM | POA: Diagnosis not present

## 2015-03-10 DIAGNOSIS — I10 Essential (primary) hypertension: Secondary | ICD-10-CM | POA: Insufficient documentation

## 2015-03-10 DIAGNOSIS — Z79899 Other long term (current) drug therapy: Secondary | ICD-10-CM | POA: Insufficient documentation

## 2015-03-10 DIAGNOSIS — R1010 Upper abdominal pain, unspecified: Secondary | ICD-10-CM | POA: Diagnosis present

## 2015-03-10 HISTORY — DX: Heart failure, unspecified: I50.9

## 2015-03-10 LAB — COMPREHENSIVE METABOLIC PANEL
ALT: 51 U/L (ref 14–54)
ANION GAP: 7 (ref 5–15)
AST: 41 U/L (ref 15–41)
Albumin: 3.5 g/dL (ref 3.5–5.0)
Alkaline Phosphatase: 42 U/L (ref 38–126)
BUN: 15 mg/dL (ref 6–20)
CHLORIDE: 106 mmol/L (ref 101–111)
CO2: 26 mmol/L (ref 22–32)
Calcium: 8.6 mg/dL — ABNORMAL LOW (ref 8.9–10.3)
Creatinine, Ser: 1.19 mg/dL — ABNORMAL HIGH (ref 0.44–1.00)
GFR calc Af Amer: >60 mL/min (ref >60)
GFR calc non Af Amer: >60 mL/min (ref >60)
GLUCOSE: 120 mg/dL — AB (ref 65–99)
POTASSIUM: 3.9 mmol/L (ref 3.5–5.1)
Sodium: 139 mmol/L (ref 135–145)
TOTAL PROTEIN: 6.6 g/dL (ref 6.5–8.1)
Total Bilirubin: 3.3 mg/dL — ABNORMAL HIGH (ref 0.3–1.2)

## 2015-03-10 LAB — CBC WITH DIFFERENTIAL/PLATELET
BASOS ABS: 0 10*3/uL (ref 0.0–0.1)
Basophils Relative: 0 %
EOS PCT: 0 %
Eosinophils Absolute: 0 10*3/uL (ref 0.0–0.7)
HCT: 39.1 % (ref 36.0–46.0)
Hemoglobin: 12.8 g/dL (ref 12.0–15.0)
Lymphocytes Relative: 8 %
Lymphs Abs: 1.7 10*3/uL (ref 0.7–4.0)
MCH: 27.1 pg (ref 26.0–34.0)
MCHC: 32.7 g/dL (ref 30.0–36.0)
MCV: 82.8 fL (ref 78.0–100.0)
MONOS PCT: 6 %
Monocytes Absolute: 1.3 10*3/uL — ABNORMAL HIGH (ref 0.1–1.0)
Neutro Abs: 19.3 10*3/uL — ABNORMAL HIGH (ref 1.7–7.7)
Neutrophils Relative %: 86 %
PLATELETS: 332 10*3/uL (ref 150–400)
RBC: 4.72 MIL/uL (ref 3.87–5.11)
RDW: 15.1 % (ref 11.5–15.5)
WBC: 22.3 10*3/uL — ABNORMAL HIGH (ref 4.0–10.5)

## 2015-03-10 LAB — BRAIN NATRIURETIC PEPTIDE: B Natriuretic Peptide: 461.6 pg/mL — ABNORMAL HIGH (ref 0.0–100.0)

## 2015-03-10 LAB — LIPASE, BLOOD: Lipase: 16 U/L (ref 11–51)

## 2015-03-10 MED ORDER — IOHEXOL 300 MG/ML  SOLN
50.0000 mL | Freq: Once | INTRAMUSCULAR | Status: AC | PRN
  Administered 2015-03-10: 50 mL via INTRAVENOUS

## 2015-03-10 MED ORDER — MORPHINE SULFATE (PF) 4 MG/ML IV SOLN
4.0000 mg | Freq: Once | INTRAVENOUS | Status: AC
  Administered 2015-03-10: 4 mg via INTRAVENOUS
  Filled 2015-03-10: qty 1

## 2015-03-10 MED ORDER — ONDANSETRON HCL 4 MG/2ML IJ SOLN
4.0000 mg | Freq: Once | INTRAMUSCULAR | Status: AC
  Administered 2015-03-10: 4 mg via INTRAVENOUS
  Filled 2015-03-10: qty 2

## 2015-03-10 MED ORDER — IOHEXOL 300 MG/ML  SOLN
75.0000 mL | Freq: Once | INTRAMUSCULAR | Status: AC | PRN
  Administered 2015-03-10: 75 mL via INTRAVENOUS

## 2015-03-10 MED ORDER — IOHEXOL 300 MG/ML  SOLN
50.0000 mL | Freq: Once | INTRAMUSCULAR | Status: AC | PRN
  Administered 2015-03-10: 50 mL via ORAL

## 2015-03-10 MED ORDER — SODIUM CHLORIDE 0.9 % IV BOLUS (SEPSIS)
500.0000 mL | Freq: Once | INTRAVENOUS | Status: AC
  Administered 2015-03-10: 500 mL via INTRAVENOUS

## 2015-03-10 NOTE — ED Notes (Signed)
Abdominal this afternoon. Diarrhea all day today.

## 2015-03-10 NOTE — ED Provider Notes (Signed)
CSN: [id number]     Arrival date & time 03/10/15  2134 History  By signing my name below, I, Carmen Palmer, attest that this documentation has been prepared under the direction and in the presence of Carmen Planan Serita Degroote, DO.  Electronically Signed: Gwenyth Oberatherine Palmer, ED Scribe. 03/10/2015. 9:57 PM.   Chief Complaint  Patient presents with  . Abdominal Pain   The history is provided by the patient. No language interpreter was used.    HPI Comments: Carmen Palmer is a 24 y.o. female with a history of Cesarean Section, HTN and CHF who presents to the Emergency Department complaining of acute onset, intermittent, moderate upper abdominal pain that started this afternoon. She reports multiple episodes of diarrhea that started prior to the abdominal pain, nausea, increased SOB and leg swelling as associated symptoms. Her pain becomes worse with movement. Pt was on antibiotic treatment, but finished 11 days ago. She was recently diagnosed with CHF. Pt denies recent trauma or injuries. She also denies fever, vomiting, blood in her stool, melena and dysuria.   Cardiologist Jacinto HalimGanji  Past Medical History  Diagnosis Date  . Hypertension   . Asthma   . Morbid obesity with BMI of 70 and over, adult (HCC)   . Gallstones   . CHF (congestive heart failure) [facility](HCC)    Past Surgical History  Procedure Laterality Date  . No past surgeries    . Cesarean section N/A 02/01/2013    Procedure: CESAREAN SECTION;  Surgeon: Kathreen CosierBernard A Marshall, MD;  Location: [facility];  Service: Obstetrics;  Laterality: N/A;   Family History  Problem Relation Age of Onset  . Hypertension Mother   . Heart disease Father   . Hypertension Father    Social History  Substance Use Topics  . Smoking status: Never Smoker   . Smokeless tobacco: Never Used  . Alcohol Use: No   OB History    Gravida Para Term Preterm AB TAB SAB Ectopic Multiple Living   1 1 1  0 0 0 0 0 0 1     Review of Systems  Constitutional: Negative for fever and  chills.  HENT: Negative for congestion and rhinorrhea.   Eyes: Negative for redness and visual disturbance.  Respiratory: Negative for shortness of breath and wheezing.   Cardiovascular: Negative for chest pain and palpitations.  Gastrointestinal: Positive for abdominal pain. Negative for nausea and vomiting.  Genitourinary: Negative for dysuria and urgency.  Musculoskeletal: Negative for myalgias and arthralgias.  Skin: Negative for pallor and wound.  Neurological: Negative for dizziness and headaches.   Allergies  Review of patient's allergies indicates no known allergies.  Home Medications   Prior to Admission medications   Medication Sig Start Date End Date Taking? Authorizing Provider  acetaminophen (TYLENOL) 500 MG tablet Take 1,000 mg by mouth every 6 (six) hours as needed for moderate pain.    Historical Provider, MD  albuterol (PROVENTIL HFA;VENTOLIN HFA) 108 (90 BASE) MCG/ACT inhaler Inhale 2 puffs into the lungs every 6 (six) hours as needed for shortness of breath.    Historical Provider, MD  famotidine (PEPCID) 20 MG tablet Take 1 tablet (20 mg total) by mouth 2 (two) times daily. 07/05/13   Eber HongBrian Miller, MD  PRESCRIPTION MEDICATION Take 1 tablet by mouth daily. Takes 2 blood pressure medications daily-unknown name    Historical Provider, MD   BP 153/111 mmHg  Pulse 124  Temp(Src) 99.4 F (37.4 C) (Oral)  Resp 24  Ht 5\' 3"  (1.6 m)  Wt 400  lb (181.439 kg)  BMI 70.87 kg/m2  SpO2 100% Physical Exam  Constitutional: She is oriented to person, place, and time. She appears well-developed and well-nourished. No distress.  Morbidly obese  HENT:  Head: Normocephalic and atraumatic.  Eyes: EOM are normal. Pupils are equal, round, and reactive to light.  Neck: Normal range of motion. Neck supple.  Cardiovascular: Normal rate and regular rhythm.  Exam reveals no gallop and no friction rub.   No murmur heard. Pulmonary/Chest: Effort normal. She has no wheezes. She has no  rales.  Abdominal: Soft. She exhibits no distension. There is tenderness.  TTP of the abdomen, worse in the epigastrium Difficult abdominal exam due to obesity  Musculoskeletal: She exhibits edema. She exhibits no tenderness.  4+ edema up to the thighs  Neurological: She is alert and oriented to person, place, and time.  Skin: Skin is warm and dry. She is not diaphoretic.  Psychiatric: She has a normal mood and affect. Her behavior is normal.  Nursing note and vitals reviewed.   ED Course  Procedures  DIAGNOSTIC STUDIES: Oxygen Saturation is 100% on RA, normal by my interpretation.    COORDINATION OF CARE: 9:55 PM Discussed treatment plan with pt which includes CT Abdomen and lab work. She agreed to plan.  Labs Review Labs Reviewed - No data to display  Imaging Review No results found. I have personally reviewed and evaluated these images and lab results as part of my medical decision-making.   EKG Interpretation None      MDM   Final diagnoses:  None    24 yo F with a chief complaint of epigastric abdominal pain. This been going on for the past day. Patient feels like the pain is severe. Unsure if it's worse with eating or drinking. Was recently diagnosed with CHF sees Dr. Jacinto Halim.  Patient feels that her lower extremity edema has gotten mildly worse as well as she's had some worsening shortness of breath on exertion.  Patient tachycardic on arrival into the 120s. Abdominal exam is strongly difficult secondary to patient's size. Will obtain a CT scan of the abdomen and pelvis to fully evaluate. CBC concerning for leukocytosis of 22.3.  I personally performed the services described in this documentation, which was scribed in my presence. The recorded information has been reviewed and is accurate.  Awaiting CT, turned over to Dr. Blinda Leatherwood.    Medications administered to the patient during their visit and any new prescriptions provided to the patient are listed  below.  Medications given during this visit Medications  morphine 4 MG/ML injection 4 mg (4 mg Intravenous Given 03/10/15 2250)  ondansetron (ZOFRAN) injection 4 mg (4 mg Intravenous Given 03/10/15 2245)  sodium chloride 0.9 % bolus 500 mL (0 mLs Intravenous Stopped 03/11/15 0100)  iohexol (OMNIPAQUE) 300 MG/ML solution 50 mL (50 mLs Oral Contrast Given 03/10/15 2349)  iohexol (OMNIPAQUE) 300 MG/ML solution 50 mL (50 mLs Intravenous Contrast Given 03/10/15 2348)  iohexol (OMNIPAQUE) 300 MG/ML solution 75 mL (75 mLs Intravenous Contrast Given 03/10/15 2348)    Discharge Medication List as of 03/11/2015  1:03 AM    START taking these medications   Details  diphenoxylate-atropine (LOMOTIL) 2.5-0.025 MG tablet Take 2 tablets by mouth 4 (four) times daily as needed for diarrhea or loose stools., Starting 03/11/2015, Until Discontinued, Print    HYDROcodone-acetaminophen (NORCO/VICODIN) 5-325 MG tablet Take 1-2 tablets by mouth every 4 (four) hours as needed for moderate pain., Starting 03/11/2015, Until Discontinued, Print  promethazine (PHENERGAN) 25 MG tablet Take 1 tablet (25 mg total) by mouth every 6 (six) hours as needed for nausea or vomiting., Starting 03/11/2015, Until Discontinued, Print          Carmen Plan, DO 03/11/15 1501

## 2015-03-11 MED ORDER — DIPHENOXYLATE-ATROPINE 2.5-0.025 MG PO TABS: 2.0000 | ORAL_TABLET | Freq: Four times a day (QID) | ORAL | Status: AC | PRN

## 2015-03-11 MED ORDER — HYDROCODONE-ACETAMINOPHEN 5-325 MG PO TABS: 1.0000 | ORAL_TABLET | ORAL | Status: AC | PRN

## 2015-03-11 MED ORDER — PROMETHAZINE HCL 25 MG PO TABS: 25.0000 mg | ORAL_TABLET | Freq: Four times a day (QID) | ORAL | Status: AC | PRN

## 2015-03-11 NOTE — Discharge Instructions (Signed)

## 2015-03-11 NOTE — ED Provider Notes (Signed)
Patient signed out to me to follow-up on CT scan. Patient was seen initially for abdominal pain and has been expressing diarrhea as well. Lab work revealed leukocytosis and mild elevation of bilirubin with remainder of LFTs normal. CT scan was performed and results reviewed. There is no acute abnormality seen. Patient will require symptomatic treatment for abdominal pain and diarrhea, close follow-up with primary care doctor. Return if her symptoms worsen.  Gilda Creasehristopher J Pollina, MD 03/11/15 [phone]

## 2016-01-01 ENCOUNTER — Encounter: Payer: Self-pay | Admitting: *Deleted

## 2016-08-15 ENCOUNTER — Emergency Department (HOSPITAL_COMMUNITY)
Admission: EM | Admit: 2016-08-15 | Discharge: 2016-08-16 | Disposition: A | Discharge status: Home / Self Care | Payer: Medicaid Other | Attending: Emergency Medicine | Admitting: Emergency Medicine

## 2016-08-15 ENCOUNTER — Encounter (HOSPITAL_COMMUNITY): Payer: Self-pay | Admitting: Emergency Medicine

## 2016-08-15 ENCOUNTER — Emergency Department (HOSPITAL_COMMUNITY): Payer: Medicaid Other

## 2016-08-15 DIAGNOSIS — R05 Cough: Secondary | ICD-10-CM | POA: Diagnosis not present

## 2016-08-15 DIAGNOSIS — I509 Heart failure, unspecified: Secondary | ICD-10-CM | POA: Insufficient documentation

## 2016-08-15 DIAGNOSIS — F17200 Nicotine dependence, unspecified, uncomplicated: Secondary | ICD-10-CM | POA: Insufficient documentation

## 2016-08-15 DIAGNOSIS — I110 Hypertensive heart disease with heart failure: Secondary | ICD-10-CM | POA: Insufficient documentation

## 2016-08-15 DIAGNOSIS — J45909 Unspecified asthma, uncomplicated: Secondary | ICD-10-CM | POA: Insufficient documentation

## 2016-08-15 DIAGNOSIS — R059 Cough, unspecified: Secondary | ICD-10-CM

## 2016-08-15 DIAGNOSIS — Z79899 Other long term (current) drug therapy: Secondary | ICD-10-CM | POA: Insufficient documentation

## 2016-08-15 LAB — BASIC METABOLIC PANEL
ANION GAP: 8 (ref 5–15)
BUN: 12 mg/dL (ref 6–20)
CHLORIDE: 107 mmol/L (ref 101–111)
CO2: 26 mmol/L (ref 22–32)
Calcium: 8.7 mg/dL — ABNORMAL LOW (ref 8.9–10.3)
Creatinine, Ser: 1.03 mg/dL — ABNORMAL HIGH (ref 0.44–1.00)
GFR calc Af Amer: >60 mL/min (ref >60)
GFR calc non Af Amer: >60 mL/min (ref >60)
GLUCOSE: 109 mg/dL — AB (ref 65–99)
Potassium: 3.3 mmol/L — ABNORMAL LOW (ref 3.5–5.1)
SODIUM: 141 mmol/L (ref 135–145)

## 2016-08-15 LAB — CBC WITH DIFFERENTIAL/PLATELET
Basophils Absolute: 0 10*3/uL (ref 0.0–0.1)
Basophils Relative: 0 %
Eosinophils Absolute: 0.3 10*3/uL (ref 0.0–0.7)
Eosinophils Relative: 3 %
HCT: 39.1 % (ref 36.0–46.0)
HEMOGLOBIN: 13.3 g/dL (ref 12.0–15.0)
LYMPHS ABS: 2.2 10*3/uL (ref 0.7–4.0)
LYMPHS PCT: 23 %
MCH: 29.3 pg (ref 26.0–34.0)
MCHC: 34 g/dL (ref 30.0–36.0)
MCV: 86.1 fL (ref 78.0–100.0)
Monocytes Absolute: 0.6 10*3/uL (ref 0.1–1.0)
Monocytes Relative: 7 %
NEUTROS PCT: 67 %
Neutro Abs: 6.4 10*3/uL (ref 1.7–7.7)
Platelets: 246 10*3/uL (ref 150–400)
RBC: 4.54 MIL/uL (ref 3.87–5.11)
RDW: 14 % (ref 11.5–15.5)
WBC: 9.5 10*3/uL (ref 4.0–10.5)

## 2016-08-15 LAB — BRAIN NATRIURETIC PEPTIDE: B Natriuretic Peptide: 19.1 pg/mL (ref 0.0–100.0)

## 2016-08-15 LAB — D-DIMER, QUANTITATIVE: D-Dimer, Quant: 0.44 ug{FEU}/mL (ref 0.00–0.50)

## 2016-08-15 MED ORDER — ALBUTEROL SULFATE (2.5 MG/3ML) 0.083% IN NEBU
5.0000 mg | INHALATION_SOLUTION | Freq: Once | RESPIRATORY_TRACT | Status: AC
  Administered 2016-08-15: 5 mg via RESPIRATORY_TRACT
  Filled 2016-08-15: qty 6

## 2016-08-15 MED ORDER — SODIUM CHLORIDE 0.9 % IV BOLUS (SEPSIS)
1000.0000 mL | Freq: Once | INTRAVENOUS | Status: AC
  Administered 2016-08-15: 1000 mL via INTRAVENOUS

## 2016-08-15 NOTE — ED Provider Notes (Signed)
[facility]-EMERGENCY DEPT Provider Note   CSN: [id number] Arrival date & time: 08/15/16  2017   By signing my name below, I, Clarisse Gouge, attest that this documentation has been prepared under the direction and in the presence of Elpidio Anis, PA-C. Electronically signed, Clarisse Gouge, ED Scribe. 08/15/16. 11:41 PM.   History   Chief Complaint Chief Complaint  Patient presents with  . Cough   The history is provided by the patient and medical records. No language interpreter was used.    Carmen Palmer is a 26 y.o. female with h/o asthma who presents to the Emergency Department with concern for persistent, dry cough x 3 days. She states this is causing frequent post tussive vomiting, chest pain with the worst cough only. Associated nasal congestion with sneezing and clear nasal drainage. Pt has an albuterol inhaler at home, which provides minimal relief. No other modifying factors noted. She states her son is at home with similar URI symptoms. No SOB, fever noted. No other complaints at this time.  Past Medical History:  Diagnosis Date  . Asthma   . CHF (congestive heart failure) (HCC)   . Gallstones   . Hypertension   . Morbid obesity with BMI of 70 and over, adult [facility])     Patient Active Problem List   Diagnosis Date Noted  . Cholelithiasis complicating pregnancy in third trimester, antepartum 01/07/2013    Past Surgical History:  Procedure Laterality Date  . CESAREAN SECTION N/A 02/01/2013   Procedure: CESAREAN SECTION;  Surgeon: Kathreen Cosier, MD;  Location: [facility] ORS;  Service: Obstetrics;  Laterality: N/A;  . NO PAST SURGERIES      OB History    Gravida Para Term Preterm AB Living   1 1 1  0 0 1   SAB TAB Ectopic Multiple Live Births   0 0 0 0 1       Home Medications    Prior to Admission medications   Medication Sig Start Date End Date Taking? Authorizing Provider  albuterol (PROVENTIL HFA;VENTOLIN HFA) 108 (90 BASE) MCG/ACT inhaler Inhale 2 puffs into  the lungs every 6 (six) hours as needed for shortness of breath.   Yes [provider]  carvedilol (COREG) 25 MG tablet Take 25 mg by mouth 2 (two) times daily with a meal.   Yes [provider]  chlorthalidone (HYGROTON) 25 MG tablet Take 25 mg by mouth daily.   Yes [provider]  spironolactone (ALDACTONE) 50 MG tablet Take 50 mg by mouth daily.   Yes [provider]  diphenoxylate-atropine (LOMOTIL) 2.5-0.025 MG tablet Take 2 tablets by mouth 4 (four) times daily as needed for diarrhea or loose stools. Patient not taking: Reported on 08/15/2016 03/11/15   Gilda Crease, MD  famotidine (PEPCID) 20 MG tablet Take 1 tablet (20 mg total) by mouth 2 (two) times daily. Patient not taking: Reported on 08/15/2016 07/05/13   Eber Hong, MD  HYDROcodone-acetaminophen (NORCO/VICODIN) 5-325 MG tablet Take 1-2 tablets by mouth every 4 (four) hours as needed for moderate pain. Patient not taking: Reported on 08/15/2016 03/11/15   Gilda Crease, MD  promethazine (PHENERGAN) 25 MG tablet Take 1 tablet (25 mg total) by mouth every 6 (six) hours as needed for nausea or vomiting. Patient not taking: Reported on 08/15/2016 03/11/15   Gilda Crease, MD    Family History Family History  Problem Relation Age of Onset  . Hypertension Mother   . Heart disease Father   . Hypertension  Father     Social History Social History  Substance Use Topics  . Smoking status: Current Every Day Smoker  . Smokeless tobacco: Never Used  . Alcohol use No     Allergies   Patient has no known allergies.   Review of Systems Review of Systems  Constitutional: Negative for fever.  HENT: Positive for congestion, rhinorrhea and sneezing.   Respiratory: Positive for cough. Negative for shortness of breath.   Cardiovascular: Positive for chest pain (only with cough).  Gastrointestinal: Positive for vomiting (post-tussive).  All other systems reviewed and are  negative.   Physical Exam Updated Vital Signs BP (!) 152/98 (BP Location: Left Wrist)   Pulse (!) 116   Temp 99.3 F (37.4 C) (Oral)   Resp 20   SpO2 99%   Physical Exam  Constitutional: She is oriented to person, place, and time. She appears well-developed and well-nourished.  HENT:  Head: Normocephalic.  Nasal mucosal swelling and erythema; oropharynx benign  Eyes: EOM are normal.  Neck: Normal range of motion.  Cardiovascular: Normal rate.   No murmur heard. Pulmonary/Chest: Effort normal. She has no wheezes. She has no rhonchi. She has no rales.  Lungs clear bilaterally  Abdominal: She exhibits no distension.  Musculoskeletal: Normal range of motion.  Neurological: She is alert and oriented to person, place, and time.  Psychiatric: She has a normal mood and affect.  Nursing note and vitals reviewed.    ED Treatments / Results  DIAGNOSTIC STUDIES: Oxygen Saturation is 99% on RA, NL by my interpretation.    COORDINATION OF CARE: 11:16 PM-Discussed next steps with pt. Pt verbalized understanding and is agreeable with the plan. Will order blood work and further imaging.   Labs (all labs ordered are listed, but only abnormal results are displayed) Labs Reviewed  CBC WITH DIFFERENTIAL/PLATELET  BASIC METABOLIC PANEL  D-DIMER, QUANTITATIVE (NOT AT [facility])  BRAIN NATRIURETIC PEPTIDE    EKG  EKG Interpretation None       Radiology Dg Chest 2 View  Result Date: 08/15/2016 CLINICAL DATA:  Asthma.  Cough and shortness of breath. EXAM: CHEST  2 VIEW COMPARISON:  March 10, 2015 FINDINGS: Stable cardiomegaly. No pneumothorax. No pulmonary nodules or masses. Mild interstitial prominence with no focal infiltrate, nodule, or mass. IMPRESSION: 1. Mild cardiomegaly. Mild interstitial prominence without infiltrate could represent pulmonary venous congestion/mild edema, atypical infection, or bronchitic change. Electronically Signed   By: Gerome Sam III M.D   On:  08/15/2016 21:56    Procedures Procedures (including critical care time)  Medications Ordered in ED Medications  albuterol (PROVENTIL) (2.5 MG/3ML) 0.083% nebulizer solution 5 mg (5 mg Nebulization Given 08/15/16 2115)  sodium chloride 0.9 % bolus 1,000 mL (1,000 mLs Intravenous New Bag/Given 08/15/16 2329)     Initial Impression / Assessment and Plan / ED Course  I have reviewed the triage vital signs and the nursing notes.  Pertinent labs & imaging results that were available during my care of the patient were reviewed by me and considered in my medical decision making (see chart for details).     Patient presents for evaluation of cough without fever, chest pain or shortness of breath. Family members at home with URI as well.   CXR without infiltrate. Atypical infection or bronchitis change considered. Given patient clinical picture would favor bronchitis changes. She continues to smoke. Albuterol with some relief. D-dimer done to evaluate respiratory symptoms in the setting of tachycardia and is negative. BMP is not elevated.  She can be discharged home with supportive care.  Final Clinical Impressions(s) / ED Diagnoses   Final diagnoses:  None   1. Cough 2. Tobacco abuse  New Prescriptions New Prescriptions   No medications on file  I personally performed the services described in this documentation, which was scribed in my presence. The recorded information has been reviewed and is accurate.      Elpidio AnisUpstill, Teagan Heidrick, PA-C 08/16/16 0037    Dione BoozeGlick, David, MD 08/16/16 [phone]

## 2016-08-15 NOTE — ED Triage Notes (Signed)
Pt states she has had a cough and congestion for the past three days  Worse today  Pt states her chest is sore from all the coughing

## 2016-08-16 MED ORDER — HYDROCOD POLST-CPM POLST ER 10-8 MG/5ML PO SUER
5.0000 mL | Freq: Once | ORAL | Status: AC
  Administered 2016-08-16: 5 mL via ORAL
  Filled 2016-08-16: qty 5

## 2016-08-16 MED ORDER — HYDROCOD POLST-CPM POLST ER 10-8 MG/5ML PO SUER: 5.0000 mL | Freq: Two times a day (BID) | ORAL | 0 refills | Status: DC | PRN

## 2016-08-16 MED ORDER — HYDROCOD POLST-CPM POLST ER 10-8 MG/5ML PO SUER: 5.0000 mL | Freq: Two times a day (BID) | ORAL | 0 refills | Status: AC | PRN

## 2016-08-16 NOTE — Discharge Instructions (Signed)
See your doctor for recheck in 2-3 days, or sooner if symptoms worsen. It would be beneficial to stop smoking.

## 2018-06-19 ENCOUNTER — Other Ambulatory Visit: Payer: Self-pay | Admitting: Cardiology

## 2019-04-27 ENCOUNTER — Other Ambulatory Visit: Payer: Self-pay | Admitting: Cardiology

## 2020-10-18 ENCOUNTER — Telehealth: Payer: Self-pay | Admitting: Neurology

## 2020-10-18 ENCOUNTER — Institutional Professional Consult (permissible substitution): Payer: Self-pay | Admitting: Neurology

## 2020-10-18 NOTE — Telephone Encounter (Signed)
Pt was a no show to sleep consult appointment

## 2022-10-06 ENCOUNTER — Emergency Department (HOSPITAL_COMMUNITY)
Admission: EM | Admit: 2022-10-06 | Discharge: 2022-10-06 | Disposition: A | Discharge status: Home / Self Care | Payer: Medicaid Other | Attending: Emergency Medicine | Admitting: Emergency Medicine

## 2022-10-06 ENCOUNTER — Other Ambulatory Visit: Payer: Self-pay

## 2022-10-06 ENCOUNTER — Emergency Department (HOSPITAL_COMMUNITY): Payer: Medicaid Other

## 2022-10-06 ENCOUNTER — Encounter (HOSPITAL_COMMUNITY): Payer: Self-pay

## 2022-10-06 DIAGNOSIS — M542 Cervicalgia: Secondary | ICD-10-CM | POA: Diagnosis not present

## 2022-10-06 DIAGNOSIS — I110 Hypertensive heart disease with heart failure: Secondary | ICD-10-CM | POA: Diagnosis not present

## 2022-10-06 DIAGNOSIS — I509 Heart failure, unspecified: Secondary | ICD-10-CM | POA: Diagnosis not present

## 2022-10-06 DIAGNOSIS — F17200 Nicotine dependence, unspecified, uncomplicated: Secondary | ICD-10-CM | POA: Diagnosis not present

## 2022-10-06 DIAGNOSIS — R0789 Other chest pain: Secondary | ICD-10-CM | POA: Insufficient documentation

## 2022-10-06 DIAGNOSIS — Y92410 Unspecified street and highway as the place of occurrence of the external cause: Secondary | ICD-10-CM | POA: Diagnosis not present

## 2022-10-06 DIAGNOSIS — Z7951 Long term (current) use of inhaled steroids: Secondary | ICD-10-CM | POA: Diagnosis not present

## 2022-10-06 DIAGNOSIS — J45909 Unspecified asthma, uncomplicated: Secondary | ICD-10-CM | POA: Diagnosis not present

## 2022-10-06 LAB — COMPREHENSIVE METABOLIC PANEL
ALT: 25 U/L (ref 0–44)
AST: 22 U/L (ref 15–41)
Albumin: 3.8 g/dL (ref 3.5–5.0)
Alkaline Phosphatase: 62 U/L (ref 38–126)
Anion gap: 12 (ref 5–15)
BUN: 15 mg/dL (ref 6–20)
CO2: 25 mmol/L (ref 22–32)
Calcium: 9.5 mg/dL (ref 8.9–10.3)
Chloride: 102 mmol/L (ref 98–111)
Creatinine, Ser: 0.94 mg/dL (ref 0.44–1.00)
GFR, Estimated: >60 mL/min (ref >60)
Glucose, Bld: 123 mg/dL — ABNORMAL HIGH (ref 70–99)
Potassium: 4.2 mmol/L (ref 3.5–5.1)
Sodium: 139 mmol/L (ref 135–145)
Total Bilirubin: 0.8 mg/dL (ref 0.3–1.2)
Total Protein: 7.7 g/dL (ref 6.5–8.1)

## 2022-10-06 LAB — CBC
HCT: 45 % (ref 36.0–46.0)
Hemoglobin: 14.4 g/dL (ref 12.0–15.0)
MCH: 27.5 pg (ref 26.0–34.0)
MCHC: 32 g/dL (ref 30.0–36.0)
MCV: 86 fL (ref 80.0–100.0)
Platelets: 316 10*3/uL (ref 150–400)
RBC: 5.23 MIL/uL — ABNORMAL HIGH (ref 3.87–5.11)
RDW: 14.6 % (ref 11.5–15.5)
WBC: 7.8 10*3/uL (ref 4.0–10.5)
nRBC: 0 % (ref 0.0–0.2)

## 2022-10-06 LAB — I-STAT CHEM 8, ED
BUN: 17 mg/dL (ref 6–20)
Calcium, Ion: 1.11 mmol/L — ABNORMAL LOW (ref 1.15–1.40)
Chloride: 105 mmol/L (ref 98–111)
Creatinine, Ser: 1 mg/dL (ref 0.44–1.00)
Glucose, Bld: 120 mg/dL — ABNORMAL HIGH (ref 70–99)
HCT: 45 % (ref 36.0–46.0)
Hemoglobin: 15.3 g/dL — ABNORMAL HIGH (ref 12.0–15.0)
Potassium: 4.3 mmol/L (ref 3.5–5.1)
Sodium: 141 mmol/L (ref 135–145)
TCO2: 29 mmol/L (ref 22–32)

## 2022-10-06 LAB — PROTIME-INR
INR: 1 (ref 0.8–1.2)
Prothrombin Time: 13.2 seconds (ref 11.4–15.2)

## 2022-10-06 LAB — SAMPLE TO BLOOD BANK

## 2022-10-06 LAB — ETHANOL: Alcohol, Ethyl (B): <10 mg/dL (ref <10)

## 2022-10-06 LAB — LACTIC ACID, PLASMA: Lactic Acid, Venous: 1.5 mmol/L (ref 0.5–1.9)

## 2022-10-06 MED ORDER — OXYCODONE-ACETAMINOPHEN 5-325 MG PO TABS
1.0000 | ORAL_TABLET | Freq: Once | ORAL | Status: AC
  Administered 2022-10-06: 1 via ORAL
  Filled 2022-10-06: qty 1

## 2022-10-06 MED ORDER — KETOROLAC TROMETHAMINE 15 MG/ML IJ SOLN
15.0000 mg | Freq: Once | INTRAMUSCULAR | Status: AC
  Administered 2022-10-06: 15 mg via INTRAVENOUS
  Filled 2022-10-06: qty 1

## 2022-10-06 MED ORDER — IOHEXOL 350 MG/ML SOLN
75.0000 mL | Freq: Once | INTRAVENOUS | Status: AC | PRN
  Administered 2022-10-06: 75 mL via INTRAVENOUS
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
